# Patient Record
Sex: Male | Born: 1960 | Race: White | Hispanic: No | Marital: Married | Smoking: Never smoker
Health system: Southern US, Community
[De-identification: ages and names within clinical notes are randomized; demographics above are authoritative.]

## PROBLEM LIST (undated history)

## (undated) DIAGNOSIS — M1990 Unspecified osteoarthritis, unspecified site: Secondary | ICD-10-CM

## (undated) DIAGNOSIS — I10 Essential (primary) hypertension: Secondary | ICD-10-CM

## (undated) HISTORY — PX: NECK SURGERY: SHX720

## (undated) HISTORY — PX: OTHER SURGICAL HISTORY: SHX169

---

## 1985-06-26 HISTORY — PX: OTHER SURGICAL HISTORY: SHX169

## 1999-02-14 ENCOUNTER — Encounter: Payer: Self-pay | Admitting: Emergency Medicine

## 1999-02-14 ENCOUNTER — Emergency Department (HOSPITAL_COMMUNITY): Admission: EM | Admit: 1999-02-14 | Discharge: 1999-02-14 | Payer: Self-pay | Admitting: Emergency Medicine

## 2003-08-04 ENCOUNTER — Emergency Department (HOSPITAL_COMMUNITY): Admission: EM | Admit: 2003-08-04 | Discharge: 2003-08-04 | Payer: Self-pay | Admitting: Emergency Medicine

## 2003-08-08 ENCOUNTER — Ambulatory Visit (HOSPITAL_COMMUNITY): Admission: RE | Admit: 2003-08-08 | Discharge: 2003-08-08 | Payer: Self-pay | Admitting: Pediatrics

## 2003-08-18 ENCOUNTER — Ambulatory Visit (HOSPITAL_COMMUNITY): Admission: RE | Admit: 2003-08-18 | Discharge: 2003-08-18 | Payer: Self-pay | Admitting: Pediatrics

## 2003-09-14 ENCOUNTER — Ambulatory Visit (HOSPITAL_COMMUNITY): Admission: RE | Admit: 2003-09-14 | Discharge: 2003-09-15 | Payer: Self-pay | Admitting: Neurosurgery

## 2003-10-21 ENCOUNTER — Emergency Department (HOSPITAL_COMMUNITY): Admission: EM | Admit: 2003-10-21 | Discharge: 2003-10-21 | Payer: Self-pay | Admitting: Emergency Medicine

## 2010-12-22 ENCOUNTER — Other Ambulatory Visit (HOSPITAL_COMMUNITY): Payer: Self-pay | Admitting: Pediatrics

## 2010-12-22 DIAGNOSIS — R221 Localized swelling, mass and lump, neck: Secondary | ICD-10-CM

## 2010-12-26 ENCOUNTER — Other Ambulatory Visit (HOSPITAL_COMMUNITY): Payer: Self-pay

## 2010-12-26 ENCOUNTER — Ambulatory Visit (HOSPITAL_COMMUNITY)
Admission: RE | Admit: 2010-12-26 | Discharge: 2010-12-26 | Disposition: A | Discharge status: Home / Self Care | Payer: 59 | Source: Ambulatory Visit | Attending: Pediatrics | Admitting: Pediatrics

## 2010-12-26 DIAGNOSIS — R221 Localized swelling, mass and lump, neck: Secondary | ICD-10-CM

## 2010-12-26 DIAGNOSIS — R599 Enlarged lymph nodes, unspecified: Secondary | ICD-10-CM | POA: Insufficient documentation

## 2010-12-26 DIAGNOSIS — R220 Localized swelling, mass and lump, head: Secondary | ICD-10-CM | POA: Insufficient documentation

## 2010-12-26 MED ORDER — IOHEXOL 300 MG/ML  SOLN
75.0000 mL | Freq: Once | INTRAMUSCULAR | Status: AC | PRN
  Administered 2010-12-26: 75 mL via INTRAVENOUS

## 2011-01-05 ENCOUNTER — Ambulatory Visit (INDEPENDENT_AMBULATORY_CARE_PROVIDER_SITE_OTHER): Payer: 59 | Admitting: Otolaryngology

## 2011-01-05 DIAGNOSIS — D487 Neoplasm of uncertain behavior of other specified sites: Secondary | ICD-10-CM

## 2011-02-09 ENCOUNTER — Ambulatory Visit (INDEPENDENT_AMBULATORY_CARE_PROVIDER_SITE_OTHER): Payer: 59 | Admitting: Otolaryngology

## 2011-02-09 DIAGNOSIS — D487 Neoplasm of uncertain behavior of other specified sites: Secondary | ICD-10-CM

## 2013-07-23 ENCOUNTER — Telehealth: Payer: Self-pay

## 2013-07-23 NOTE — Telephone Encounter (Signed)
Pt was referred by Dr. Nevada Crane for screening colonoscopy. He will check his calendar and call me tomorrow to schedule.

## 2013-07-24 ENCOUNTER — Other Ambulatory Visit: Payer: Self-pay

## 2013-07-24 DIAGNOSIS — Z1211 Encounter for screening for malignant neoplasm of colon: Secondary | ICD-10-CM

## 2013-07-28 NOTE — Telephone Encounter (Signed)
Gastroenterology Pre-Procedure Review  Request Date: 07/25/2013 Requesting Physician: Dr. Wende Neighbors  This will be pt's first colonoscopy  PATIENT REVIEW QUESTIONS: The patient responded to the following health history questions as indicated:    1. Diabetes Melitis: no 2. Joint replacements in the past 12 months: no 3. Major health problems in the past 3 months: no 4. Has an artificial valve or MVP: no 5. Has a defibrillator: no 6. Has been advised in past to take antibiotics in advance of a procedure like teeth cleaning: no    MEDICATIONS & ALLERGIES:    Patient reports the following regarding taking any blood thinners:   Plavix? no Aspirin? no Coumadin? no  Patient confirms/reports the following medications:  Current Outpatient Prescriptions  Medication Sig Dispense Refill  . amLODipine (NORVASC) 5 MG tablet Take 5 mg by mouth daily.      . NON FORMULARY Goody's  prn       No current facility-administered medications for this visit.    Patient confirms/reports the following allergies:  No Known Allergies  No orders of the defined types were placed in this encounter.    AUTHORIZATION INFORMATION Primary Insurance:   ID #:   Group #:  Pre-Cert / Auth required Pre-Cert / Auth #:   Secondary Insurance:   ID #:   Group #:  Pre-Cert / Auth required: Pre-Cert / Auth #:   SCHEDULE INFORMATION: Procedure has been scheduled as follows:  Date: 08/15/2013             Time:  9:15 AM Location: [facility] Short Stay  This Gastroenterology Pre-Precedure Review Form is being routed to the following provider(s): Barney Drain, MD

## 2013-07-29 MED ORDER — SOD PICOSULFATE-MAG OX-CIT ACD 10-3.5-12 MG-GM-GM PO PACK: 1.0000 | PACK | ORAL | Status: DC

## 2013-07-29 NOTE — Telephone Encounter (Signed)
Rx sent to the pharmacy and instructions mailed to pt.  

## 2013-07-29 NOTE — Telephone Encounter (Signed)
PREPOPIK-DRINK WATER TO KEEP URINE LIGHT YELLOW.  PT SHOULD DROP OFF RX 3 DAYS PRIOR TO PROCEDURE.  

## 2013-08-04 ENCOUNTER — Encounter (HOSPITAL_COMMUNITY): Payer: Self-pay | Admitting: Pharmacy Technician

## 2013-08-13 ENCOUNTER — Telehealth: Payer: Self-pay

## 2013-08-13 NOTE — Telephone Encounter (Signed)
I called [employer] at [phone] and spoke to [place]. Who said that a PA is not required for a screening colonoscopy.

## 2013-08-15 ENCOUNTER — Encounter (HOSPITAL_COMMUNITY)
Admission: RE | Disposition: A | Discharge status: Home / Self Care | Payer: Self-pay | Source: Ambulatory Visit | Attending: Gastroenterology

## 2013-08-15 ENCOUNTER — Encounter (HOSPITAL_COMMUNITY): Payer: Self-pay | Admitting: *Deleted

## 2013-08-15 ENCOUNTER — Ambulatory Visit (HOSPITAL_COMMUNITY)
Admission: RE | Admit: 2013-08-15 | Discharge: 2013-08-15 | Disposition: A | Discharge status: Home / Self Care | Payer: 59 | Source: Ambulatory Visit | Attending: Gastroenterology | Admitting: Gastroenterology

## 2013-08-15 DIAGNOSIS — K648 Other hemorrhoids: Secondary | ICD-10-CM | POA: Insufficient documentation

## 2013-08-15 DIAGNOSIS — Z1211 Encounter for screening for malignant neoplasm of colon: Secondary | ICD-10-CM | POA: Insufficient documentation

## 2013-08-15 HISTORY — DX: Essential (primary) hypertension: I10

## 2013-08-15 HISTORY — PX: COLONOSCOPY: SHX5424

## 2013-08-15 SURGERY — COLONOSCOPY
Anesthesia: Moderate Sedation

## 2013-08-15 MED ORDER — MIDAZOLAM HCL 5 MG/5ML IJ SOLN
INTRAMUSCULAR | Status: DC | PRN
  Administered 2013-08-15 (×3): 2 mg via INTRAVENOUS

## 2013-08-15 MED ORDER — STERILE WATER FOR IRRIGATION IR SOLN
Status: DC | PRN
  Administered 2013-08-15: 09:00:00

## 2013-08-15 MED ORDER — SODIUM CHLORIDE 0.9 % IV SOLN
INTRAVENOUS | Status: DC
  Administered 2013-08-15: 09:00:00 via INTRAVENOUS

## 2013-08-15 MED ORDER — MEPERIDINE HCL 100 MG/ML IJ SOLN
INTRAMUSCULAR | Status: AC
  Filled 2013-08-15: qty 2

## 2013-08-15 MED ORDER — MIDAZOLAM HCL 5 MG/5ML IJ SOLN
INTRAMUSCULAR | Status: AC
  Filled 2013-08-15: qty 10

## 2013-08-15 MED ORDER — MEPERIDINE HCL 100 MG/ML IJ SOLN
INTRAMUSCULAR | Status: DC | PRN
  Administered 2013-08-15 (×2): 50 mg via INTRAVENOUS

## 2013-08-15 NOTE — Discharge Instructions (Signed)
You have internal hemorrhoids. YOU DID NOT HAVE ANY POLYPS.   IF YOU ARE GOING TO USE ALEVE AND GOODYS, YOU SHOULD TAKE PRILOSEC TO PREVENT ULCERS.  FOLLOW A HIGH FIBER DIET. AVOID ITEMS THAT CAUSE BLOATING. SEE INFO BELOW  Next colonoscopy in 10 years. .   Colonoscopy Care After Read the instructions outlined below and refer to this sheet in the next week. These discharge instructions provide you with general information on caring for yourself after you leave the hospital. While your treatment has been planned according to the most current medical practices available, unavoidable complications occasionally occur. If you have any problems or questions after discharge, call DR. FIELDS, [phone].  ACTIVITY  You may resume your regular activity, but move at a slower pace for the next 24 hours.   Take frequent rest periods for the next 24 hours.   Walking will help get rid of the air and reduce the bloated feeling in your belly (abdomen).   No driving for 24 hours (because of the medicine (anesthesia) used during the test).   You may shower.   Do not sign any important legal documents or operate any machinery for 24 hours (because of the anesthesia used during the test).    NUTRITION  Drink plenty of fluids.   You may resume your normal diet as instructed by your doctor.   Begin with a light meal and progress to your normal diet. Heavy or fried foods are harder to digest and may make you feel sick to your stomach (nauseated).   Avoid alcoholic beverages for 24 hours or as instructed.    MEDICATIONS  You may resume your normal medications.   WHAT YOU CAN EXPECT TODAY  Some feelings of bloating in the abdomen.   Passage of more gas than usual.   Spotting of blood in your stool or on the toilet paper  .  IF YOU HAD POLYPS REMOVED DURING THE COLONOSCOPY:  Eat a soft diet IF YOU HAVE NAUSEA, BLOATING, ABDOMINAL PAIN, OR VOMITING.    FINDING OUT THE RESULTS OF  YOUR TEST Not all test results are available during your visit. DR. Oneida Alar WILL CALL YOU WITHIN 7 DAYS OF YOUR PROCEDUE WITH YOUR RESULTS. Do not assume everything is normal if you have not heard from DR. FIELDS IN ONE WEEK, CALL HER OFFICE AT [phone].  SEEK IMMEDIATE MEDICAL ATTENTION AND CALL THE OFFICE: [phone] IF:  You have more than a spotting of blood in your stool.   Your belly is swollen (abdominal distention).   You are nauseated or vomiting.   You have a temperature over 101F.   You have abdominal pain or discomfort that is severe or gets worse throughout the day.  High-Fiber Diet A high-fiber diet changes your normal diet to include more whole grains, legumes, fruits, and vegetables. Changes in the diet involve replacing refined carbohydrates with unrefined foods. The calorie level of the diet is essentially unchanged. The Dietary Reference Intake (recommended amount) for adult males is 38 grams per day. For adult females, it is 25 grams per day. Pregnant and lactating women should consume 28 grams of fiber per day. Fiber is the intact part of a plant that is not broken down during digestion. Functional fiber is fiber that has been isolated from the plant to provide a beneficial effect in the body. PURPOSE  Increase stool bulk.   Ease and regulate bowel movements.   Lower cholesterol.  INDICATIONS THAT YOU NEED MORE FIBER  Constipation and  hemorrhoids.   Uncomplicated diverticulosis (intestine condition) and irritable bowel syndrome.   Weight management.   As a protective measure against hardening of the arteries (atherosclerosis), diabetes, and cancer.   GUIDELINES FOR INCREASING FIBER IN THE DIET  Start adding fiber to the diet slowly. A gradual increase of about 5 more grams (2 slices of whole-wheat bread, 2 servings of most fruits or vegetables, or 1 bowl of high-fiber cereal) per day is best. Too rapid an increase in fiber may result in constipation,  flatulence, and bloating.   Drink enough water and fluids to keep your urine clear or pale yellow. Water, juice, or caffeine-free drinks are recommended. Not drinking enough fluid may cause constipation.   Eat a variety of high-fiber foods rather than one type of fiber.   Try to increase your intake of fiber through using high-fiber foods rather than fiber pills or supplements that contain small amounts of fiber.   The goal is to change the types of food eaten. Do not supplement your present diet with high-fiber foods, but replace foods in your present diet.  INCLUDE A VARIETY OF FIBER SOURCES  Replace refined and processed grains with whole grains, canned fruits with fresh fruits, and incorporate other fiber sources. White rice, white breads, and most bakery goods contain little or no fiber.   Brown whole-grain rice, buckwheat oats, and many fruits and vegetables are all good sources of fiber. These include: broccoli, Brussels sprouts, cabbage, cauliflower, beets, sweet potatoes, white potatoes (skin on), carrots, tomatoes, eggplant, squash, berries, fresh fruits, and dried fruits.   Cereals appear to be the richest source of fiber. Cereal fiber is found in whole grains and bran. Bran is the fiber-rich outer coat of cereal grain, which is largely removed in refining. In whole-grain cereals, the bran remains. In breakfast cereals, the largest amount of fiber is found in those with "bran" in their names. The fiber content is sometimes indicated on the label.   You may need to include additional fruits and vegetables each day.   In baking, for 1 cup white flour, you may use the following substitutions:   1 cup whole-wheat flour minus 2 tablespoons.   1/2 cup white flour plus 1/2 cup whole-wheat flour.   Hemorrhoids Hemorrhoids are dilated (enlarged) veins around the rectum. Sometimes clots will form in the veins. This makes them swollen and painful. These are called thrombosed  hemorrhoids. Causes of hemorrhoids include:  Constipation.   Straining to have a bowel movement.   HEAVY LIFTING HOME CARE INSTRUCTIONS  Eat a well balanced diet and drink 6 to 8 glasses of water every day to avoid constipation. You may also use a bulk laxative.   Avoid straining to have bowel movements.   Keep anal area dry and clean.   Do not use a donut shaped pillow or sit on the toilet for long periods. This increases blood pooling and pain.   Move your bowels when your body has the urge; this will require less straining and will decrease pain and pressure.

## 2013-08-15 NOTE — Op Note (Signed)
[facility] [address] [place], [address]   COLONOSCOPY PROCEDURE REPORT  PATIENT: Eugene Hill, Eugene Hill  MR#: [id number] BIRTHDATE: March 01, 1961 , 52  yrs. old GENDER: Male ENDOSCOPIST: Barney Drain, MD REFERRED MV:[facility], M.D. PROCEDURE DATE:  08/15/2013 PROCEDURE:   Colonoscopy, screening INDICATIONS:Average risk patient for colon cancer. MEDICATIONS: Demerol 100 mg IV and Versed 6 mg IV  DESCRIPTION OF PROCEDURE:    Physical exam was performed.  Informed consent was obtained from the patient after explaining the benefits, risks, and alternatives to procedure.  The patient was connected to monitor and placed in left lateral position. Continuous oxygen was provided by nasal cannula and IV medicine administered through an indwelling cannula.  After administration of sedation and rectal exam, the patients rectum was intubated and the EC-[id number] ([id number])  colonoscope was advanced under direct visualization to the ileum.  The scope was removed slowly by carefully examining the color, texture, anatomy, and integrity mucosa on the way out.  The patient was recovered in endoscopy and discharged home in satisfactory condition.       COLON FINDINGS: The mucosa appeared normal in the terminal ileum.  , A normal appearing cecum, ileocecal valve, and appendiceal orifice were identified.  The ascending, hepatic flexure, transverse, splenic flexure, descending, sigmoid colon and rectum appeared unremarkable.  No polyps or cancers were seen.  , and Small internal hemorrhoids were found.  PREP QUALITY: good.  CECAL W/D TIME: 12 minutes     COMPLICATIONS: None  ENDOSCOPIC IMPRESSION: 1.   Normal mucosa in the terminal ileum 2.   Normal colon 3.   Small internal hemorrhoids  RECOMMENDATIONS: HIGH FIBER DIET USE PPI WITH ALEVE AND GOODY POWDERS TCS IN 10 YEARS       _______________________________ eSignedBarney Drain, MD 08/15/2013 10:42 AM

## 2013-08-15 NOTE — H&P (Signed)
  Primary Care Physician:  Delphina Cahill, MD Primary Gastroenterologist:  Dr. Oneida Alar  Pre-Procedure History & Physical: HPI:  Eugene Hill is a 53 y.o. male here for COLON CANCER SCREENING.  No past medical history on file.  No past surgical history on file.  Prior to Admission medications   Medication Sig Start Date End Date Taking? Authorizing Provider  amLODipine (NORVASC) 5 MG tablet Take 5 mg by mouth daily.   Yes Historical Provider, MD  Aspirin-Acetaminophen-Caffeine (GOODY HEADACHE PO) Take 1 packet by mouth daily as needed. headaches    Historical Provider, MD  naproxen sodium (ALEVE) 220 MG tablet Take 220 mg by mouth daily as needed. For headaches or aches and pain    Historical Provider, MD    Allergies as of 07/24/2013  . (No Known Allergies)    No family history on file.  History   Social History  . Marital Status: Married    Spouse Name: N/A    Number of Children: N/A  . Years of Education: N/A   Occupational History  . Not on file.   Social History Main Topics  . Smoking status: Not on file  . Smokeless tobacco: Not on file  . Alcohol Use: Not on file  . Drug Use: Not on file  . Sexual Activity: Not on file   Other Topics Concern  . Not on file   Social History Narrative  . No narrative on file    Review of Systems: See HPI, otherwise negative ROS   Physical Exam:  General:   Alert,  pleasant and cooperative in NAD Head:  Normocephalic and atraumatic. Neck:  Supple; Lungs:  Clear throughout to auscultation.    Heart:  Regular rate and rhythm. Abdomen:  Soft, nontender and nondistended. Normal bowel sounds, without guarding, and without rebound.   Neurologic:  Alert and  oriented x4;  grossly normal neurologically.  Impression/Plan:    SCREENING  Plan:  1. TCS TODAY

## 2013-08-18 ENCOUNTER — Encounter (HOSPITAL_COMMUNITY): Payer: Self-pay | Admitting: Gastroenterology

## 2014-07-07 DIAGNOSIS — I10 Essential (primary) hypertension: Secondary | ICD-10-CM | POA: Insufficient documentation

## 2014-07-08 ENCOUNTER — Other Ambulatory Visit (HOSPITAL_COMMUNITY): Payer: Self-pay | Admitting: Orthopaedic Surgery

## 2014-07-08 DIAGNOSIS — M25561 Pain in right knee: Secondary | ICD-10-CM

## 2014-07-14 ENCOUNTER — Other Ambulatory Visit (HOSPITAL_COMMUNITY): Payer: Self-pay | Admitting: Orthopaedic Surgery

## 2014-07-14 ENCOUNTER — Ambulatory Visit (HOSPITAL_COMMUNITY)
Admission: RE | Admit: 2014-07-14 | Discharge: 2014-07-14 | Disposition: A | Discharge status: Home / Self Care | Payer: 59 | Source: Ambulatory Visit | Attending: Orthopaedic Surgery | Admitting: Orthopaedic Surgery

## 2014-07-14 DIAGNOSIS — M25561 Pain in right knee: Secondary | ICD-10-CM

## 2014-07-14 DIAGNOSIS — M7989 Other specified soft tissue disorders: Secondary | ICD-10-CM | POA: Diagnosis not present

## 2014-07-20 ENCOUNTER — Encounter: Payer: Self-pay | Admitting: Orthopedic Surgery

## 2014-07-20 ENCOUNTER — Other Ambulatory Visit: Payer: Self-pay | Admitting: *Deleted

## 2014-07-20 ENCOUNTER — Telehealth: Payer: Self-pay | Admitting: Orthopedic Surgery

## 2014-07-20 ENCOUNTER — Ambulatory Visit (INDEPENDENT_AMBULATORY_CARE_PROVIDER_SITE_OTHER): Payer: 59 | Admitting: Orthopedic Surgery

## 2014-07-20 VITALS — BP 134/89 | Ht 70.0 in | Wt 245.0 lb

## 2014-07-20 DIAGNOSIS — S83519A Sprain of anterior cruciate ligament of unspecified knee, initial encounter: Secondary | ICD-10-CM | POA: Insufficient documentation

## 2014-07-20 DIAGNOSIS — S83249A Other tear of medial meniscus, current injury, unspecified knee, initial encounter: Secondary | ICD-10-CM | POA: Insufficient documentation

## 2014-07-20 DIAGNOSIS — S83511S Sprain of anterior cruciate ligament of right knee, sequela: Secondary | ICD-10-CM

## 2014-07-20 DIAGNOSIS — M1711 Unilateral primary osteoarthritis, right knee: Secondary | ICD-10-CM | POA: Insufficient documentation

## 2014-07-20 DIAGNOSIS — S83241A Other tear of medial meniscus, current injury, right knee, initial encounter: Secondary | ICD-10-CM

## 2014-07-20 NOTE — Telephone Encounter (Signed)
NOTED AND SURGERY SCHEDULED

## 2014-07-20 NOTE — Telephone Encounter (Signed)
Received call back from patient; states he does wish to schedule the surgery for 07/31/14, as discussed at appointment earlier today.  His ph# is [phone]

## 2014-07-20 NOTE — Patient Instructions (Signed)
Call back to confirm Feb 5 surgery date, Right knee arthroscopy with medial menisectomy

## 2014-07-20 NOTE — Progress Notes (Signed)
Patient ID: Eugene Hill, male   DOB: 1961-03-02, 54 y.o.   MRN: [id number] New   Referred by Dr Luna Glasgow   Chief Complaint  Patient presents with  . Knee Pain    Right knee pain and swelling- chronic torn ACL PCL intact, REF. KEELING    History of present illness this patient is 54 years old he has had a meniscectomy back in 1987 injured his right knee several years ago. He doesn't know how. At that time he was found to have a anterior cruciate ligament tear which was not repaired  He is referred to me for further treatment and management. His main problem is knee pain, and inability to squat and kneel which is required for his profession. He complains of sharp stabbing pain associated with swelling stiffness and giving out worse at night and worse after activity. He was treated with Aleve. His knee pain is worse with crawling and squatting.  Review of systems he listed all of his systems reviewed is normal except for the musculoskeletal system as described  Past medical history of hypertension previous fracture  1981 he had a left forearm fracture. Surgery included right knee arthroscopy 1987 had a neck procedure in 2005  Current medications are amlodipine 5 mg One-A-Day vitamins and Aleve one tablet as needed.  No allergies  Family history hypertension heart failure no anesthesia problems  Social history he does not smoke he may have 2-3 drinks per week he does not use any street drugs and he works in maintenance and sheet metal  His evaluation includes an MRI dated 07/14/2014 Patellofemoral: Focal thinning of the cartilage at the apex and medial facet of the patella and in the trochlear groove of the distal femur. 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   IMPRESSION: 1. Extensive displaced bucket-handle tear involving the medial meniscus. 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 2. 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 3. Tiny focal superior surface tear of the midbody of the lateral meniscus. 4. 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  I agree basically has an arthritic knee with an old anterior cruciate ligament tear and a fairly significant displaced bucket-handle tear of the medial meniscus which is probably from chronic anterior cruciate ligament deficient knee  His notes from Dr. Luna Glasgow office also noted. They confirm BASIC history I have elucidated above  BP 134/89 mmHg  Ht 5\' 10"  (1.778 m)  Wt 245 lb (111.131 kg)  BMI 35.15 kg/m2 General appearance muscular well-built mesomorphic body habitus. He is oriented 3. His mood is pleasant his affect is normal. He ambulated without assistive devices  His upper extremities are normal  His left lower extremity skin is normal good muscle tone normal ligaments. Range of motion is full he has no swelling or tenderness neurovascular exam is intact lymph nodes are negative and balance is normal  Right knee shows no anterior cruciate ligament instability at 90 or at 20 collateral ligaments are stable muscle tone and strength are grade 5 skin is intact pulses are good temperature is normal lymph nodes are negative sensation is intact pathologic reflexes none balance is normal. He does have medial joint line tenderness and tenderness in the proximal tibia with no joint effusion and he has flexion loss of 15 compared to the left knee  McMurray sign positive  Encounter Diagnoses  Name Primary?  . Torn medial  meniscus, right, initial encounter Yes  . Anterior cruciate ligament tear, right, sequela   . Primary osteoarthritis of right knee     Assessment the patient's plain films do show some arthritic changes some secondary bone changes but his joint spaces are maintained.  He basically hasn't anterior cruciate ligament chronic deficient knee with arthritic changes  and a displaced meniscal tear. His grade 4 lesion is most concerning for long-term health of his knee and function. We have discussed that the fact that he can't bend his knee may or may not be caused by his displaced meniscal tear and he may still have trouble squatting and bending the knee after surgery. However I think it's prudent to get the displaced bucket-handle tear out of his knee that should improve his overall function. He will still have arthritis and we have discussed we will need to treat that medically.  He may also need bracing.  We have both decided that it is best at this point to perform arthroscopic surgery on the right knee with a partial medial meniscectomy and limit chondral debridement of flap tears.

## 2014-07-24 ENCOUNTER — Telehealth: Payer: Self-pay | Admitting: Orthopedic Surgery

## 2014-07-24 NOTE — Telephone Encounter (Signed)
[place], [place] [phone], regarding out-patient surgery scheduled 07/31/14 at [facility], knee arthroscopy/medial meniscectomy, [id number] - no pre-authorization required per Renea R, Reference# [id number]

## 2014-07-28 ENCOUNTER — Encounter (HOSPITAL_COMMUNITY)
Admission: RE | Admit: 2014-07-28 | Discharge: 2014-07-28 | Disposition: A | Discharge status: Home / Self Care | Payer: 59 | Source: Ambulatory Visit | Attending: Orthopedic Surgery | Admitting: Orthopedic Surgery

## 2014-07-28 ENCOUNTER — Encounter (HOSPITAL_COMMUNITY): Payer: Self-pay

## 2014-07-28 ENCOUNTER — Other Ambulatory Visit: Payer: Self-pay

## 2014-07-28 DIAGNOSIS — M13861 Other specified arthritis, right knee: Secondary | ICD-10-CM | POA: Diagnosis not present

## 2014-07-28 DIAGNOSIS — S83241A Other tear of medial meniscus, current injury, right knee, initial encounter: Secondary | ICD-10-CM | POA: Diagnosis present

## 2014-07-28 DIAGNOSIS — S83281A Other tear of lateral meniscus, current injury, right knee, initial encounter: Secondary | ICD-10-CM | POA: Diagnosis not present

## 2014-07-28 DIAGNOSIS — X58XXXA Exposure to other specified factors, initial encounter: Secondary | ICD-10-CM | POA: Diagnosis not present

## 2014-07-28 DIAGNOSIS — R9431 Abnormal electrocardiogram [ECG] [EKG]: Secondary | ICD-10-CM | POA: Diagnosis not present

## 2014-07-28 DIAGNOSIS — Z8249 Family history of ischemic heart disease and other diseases of the circulatory system: Secondary | ICD-10-CM | POA: Diagnosis not present

## 2014-07-28 DIAGNOSIS — Y929 Unspecified place or not applicable: Secondary | ICD-10-CM | POA: Diagnosis not present

## 2014-07-28 DIAGNOSIS — Y939 Activity, unspecified: Secondary | ICD-10-CM | POA: Diagnosis not present

## 2014-07-28 DIAGNOSIS — S83511A Sprain of anterior cruciate ligament of right knee, initial encounter: Secondary | ICD-10-CM | POA: Diagnosis not present

## 2014-07-28 DIAGNOSIS — I10 Essential (primary) hypertension: Secondary | ICD-10-CM | POA: Diagnosis not present

## 2014-07-28 DIAGNOSIS — Y999 Unspecified external cause status: Secondary | ICD-10-CM | POA: Diagnosis not present

## 2014-07-28 HISTORY — DX: Unspecified osteoarthritis, unspecified site: M19.90

## 2014-07-28 LAB — BASIC METABOLIC PANEL
ANION GAP: 5 (ref 5–15)
BUN: 18 mg/dL (ref 6–23)
CHLORIDE: 108 mmol/L (ref 96–112)
CO2: 26 mmol/L (ref 19–32)
Calcium: 8.7 mg/dL (ref 8.4–10.5)
Creatinine, Ser: 0.88 mg/dL (ref 0.50–1.35)
GFR calc Af Amer: >90 mL/min (ref >90)
GFR calc non Af Amer: >90 mL/min (ref >90)
Glucose, Bld: 130 mg/dL — ABNORMAL HIGH (ref 70–99)
POTASSIUM: 3.9 mmol/L (ref 3.5–5.1)
Sodium: 139 mmol/L (ref 135–145)

## 2014-07-28 LAB — CBC
HCT: 46.1 % (ref 39.0–52.0)
Hemoglobin: 16.1 g/dL (ref 13.0–17.0)
MCH: 30.5 pg (ref 26.0–34.0)
MCHC: 34.9 g/dL (ref 30.0–36.0)
MCV: 87.3 fL (ref 78.0–100.0)
Platelets: 253 10*3/uL (ref 150–400)
RBC: 5.28 MIL/uL (ref 4.22–5.81)
RDW: 12.8 % (ref 11.5–15.5)
WBC: 6.5 10*3/uL (ref 4.0–10.5)

## 2014-07-28 NOTE — Pre-Procedure Instructions (Signed)
Patient given information to sign up for my chart at home. 

## 2014-07-28 NOTE — Progress Notes (Signed)
   07/28/14 0906  OBSTRUCTIVE SLEEP APNEA  Have you ever been diagnosed with sleep apnea through a sleep study? No  Do you snore loudly (loud enough to be heard through closed doors)?  0  Do you often feel tired, fatigued, or sleepy during the daytime? 0  Has anyone observed you stop breathing during your sleep? 0  Do you have, or are you being treated for high blood pressure? 1  BMI more than 35 kg/m2? 1  Age over 52 years old? 1  Neck circumference greater than 40 cm/16 inches? 0  Gender: 1  Obstructive Sleep Apnea Score 4  Score 4 or greater  Results sent to PCP

## 2014-07-28 NOTE — Patient Instructions (Signed)
Eugene Hill  07/28/2014   Your procedure is scheduled on:  07/31/2014  Report to [facility] at 8:00 AM.  Call this number if you have problems the morning of surgery: [phone]   Remember:   Do not eat food or drink liquids after midnight.   Take these medicines the morning of surgery with A SIP OF WATER: Norvasc   Do not wear jewelry, make-up or nail polish.  Do not wear lotions, powders, or perfumes. You may wear deodorant.  Do not shave 48 hours prior to surgery. Men may shave face and neck.  Do not bring valuables to the hospital.  [facility] is not responsible                  for any belongings or valuables.               Contacts, dentures or bridgework may not be worn into surgery.  Leave suitcase in the car. After surgery it may be brought to your room.  For patients admitted to the hospital, discharge time is determined by your                treatment team.               Patients discharged the day of surgery will not be allowed to drive  home.  Name and phone number of your driver: family  Special Instructions: Shower using CHG 2 nights before surgery and the night before surgery.  If you shower the day of surgery use CHG.  Use special wash - you have one bottle of CHG for all showers.  You should use approximately 1/3 of the bottle for each shower.   Please read over the following fact sheets that you were given: Anesthesia Post-op Instructions    Meniscus Injury, Arthroscopy Arthroscopy is a surgical procedure that involves the use of a small scope that has a camera and surgical instruments on the end (arthroscope). An arthroscope can be used to repair your meniscus injury.  LET [facility] CARE PROVIDER KNOW ABOUT:  Any allergies you have.  All medicines you are taking, including vitamins, herbs, eyedrops, creams, and over-the-counter medicines.  Any recent colds or infections you have had or currently have.  Previous problems you or members of your family  have had with the use of anesthetics.  Any blood disorders or blood clotting problems you have.  Previous surgeries you have had.  Medical conditions you have. RISKS AND COMPLICATIONS Generally, this is a safe procedure. However, as with any procedure, problems can occur. Possible problems include:  Damage to nerves or blood vessels.  Excess bleeding.  Blood clots.  Infection. BEFORE THE PROCEDURE  Do not eat or drink for 6-8 hours before the procedure.  Take medicines as directed by your surgeon. Ask your surgeon about changing or stopping your regular medicines.  You may have lab tests the morning of surgery. PROCEDURE  You will be given one of the following:   A medicine that numbs the area (local anesthesia).  A medicine that makes you go to sleep (general anesthesia).  A medicine injected into your spine that numbs your body below the waist (spinal anesthesia). Most often, several small cuts (incisions) are made in the knee. The arthroscope and instruments go into the incisions to repair the damage. The torn portion of the meniscus is removed.  During this time, your surgeon may find a partial or complete tear in a cruciate ligament,  such as the anterior cruciate ligament (ACL). A completely torn cruciate ligament is reconstructed by taking tissue from another part of the body (grafting) and placing it into the injured area. This requires several larger incisions to complete the repair. Sometimes, open surgery is needed for collateral ligament injuries. If a collateral ligament is found to be injured, your surgeon may staple or suture the tear through a slightly larger incision on the side of the knee. AFTER THE PROCEDURE You will be taken to the recovery area where your progress will be monitored. When you are awake, stable, and taking fluids without complications, you will be allowed to go home. This is usually the same day. However, more extensive repairs of a ligament may  require an overnight stay.  The recovery time after repairing your meniscus or ligament depends on the amount of damage to these structures. It also depends on whether or not reconstructive knee surgery was needed.   A torn or stretched ligament (ligament sprain) may take 6-8 weeks to heal. It takes about the same amount of time if your surgeon removed a torn meniscus.  A repaired meniscus may require 6-12 weeks of recovery time.  A torn ligament needing reconstructive surgery may take 6-12 months to heal fully. Document Released: 06/09/2000 Document Revised: 06/17/2013 Document Reviewed: 11/08/2012 [facility] Patient Information 2015 [place], [place]. This information is not intended to replace advice given to you by your health care provider. Make sure you discuss any questions you have with your health care provider.

## 2014-07-30 ENCOUNTER — Encounter: Payer: Self-pay | Admitting: Orthopedic Surgery

## 2014-07-30 NOTE — H&P (Signed)
New   Referred by Dr Luna Glasgow     Chief Complaint   Patient presents with   .  Knee Pain       Right knee pain and swelling- chronic torn ACL PCL intact, REF. KEELING     History of present illness this patient is 54 years old he has had a meniscectomy back in 1987 injured his right knee several years ago. He doesn't know how. At that time he was found to have a anterior cruciate ligament tear which was not repaired  He is referred to me for further treatment and management. His main problem is knee pain, and inability to squat and kneel which is required for his profession. He complains of sharp stabbing pain associated with swelling stiffness and giving out worse at night and worse after activity. He was treated with Aleve. His knee pain is worse with crawling and squatting.  Review of systems he listed all of his systems reviewed is normal except for the musculoskeletal system as described  Past medical history of hypertension previous fracture  1981 he had a left forearm fracture. Surgery included right knee arthroscopy 1987 had a neck procedure in 2005  Current medications are amlodipine 5 mg One-A-Day vitamins and Aleve one tablet as needed.  No allergies  Family history hypertension heart failure no anesthesia problems  Social history he does not smoke he may have 2-3 drinks per week he does not use any street drugs and he works in maintenance and sheet metal  His evaluation includes an MRI dated 07/14/2014 Patellofemoral: Focal thinning of the cartilage at the apex and medial facet of the patella and in the trochlear groove of the distal femur. 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   IMPRESSION: 1. Extensive displaced bucket-handle tear involving the medial meniscus. 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 2. 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 3. Tiny focal superior surface tear of the midbody of the lateral meniscus. 4. 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  I agree basically has an arthritic knee with an old anterior cruciate ligament tear and a fairly significant displaced bucket-handle tear of the medial meniscus which is probably from chronic anterior cruciate ligament deficient knee  His notes from Dr. Luna Glasgow office also noted. They confirm BASIC history I have elucidated above  BP 134/89 mmHg  Ht 5\' 10"  (1.778 m)  Wt 245 lb (111.131 kg)  BMI 35.15 kg/m2 General appearance muscular well-built mesomorphic body habitus. He is oriented 3. His mood is pleasant his affect is normal. He ambulated without assistive devices  His upper extremities are normal  His left lower extremity skin is normal good muscle tone normal ligaments. Range of motion is full he has no swelling or tenderness neurovascular exam is intact lymph nodes are negative and balance is normal  Right knee shows no anterior cruciate ligament instability at 90 or at 20 collateral ligaments are stable muscle tone and strength are grade 5 skin is intact pulses are good temperature is normal lymph nodes are negative sensation is intact pathologic reflexes none balance is normal. He does have medial joint line tenderness and tenderness in the proximal tibia with no joint effusion and he has flexion loss of 15 compared to the left knee  McMurray sign positive    Encounter Diagnoses   Name  Primary?   .  Torn medial meniscus,  right, initial encounter  Yes   .  Anterior cruciate ligament tear, right, sequela     .  Primary osteoarthritis of right knee       Assessment the patient's plain films do show some arthritic changes some secondary bone changes but his joint spaces are maintained.  He basically hasn't anterior cruciate ligament chronic deficient knee with arthritic changes and a displaced meniscal tear. His grade 4 lesion  is most concerning for long-term health of his knee and function. We have discussed that the fact that he can't bend his knee may or may not be caused by his displaced meniscal tear and he may still have trouble squatting and bending the knee after surgery. However I think it's prudent to get the displaced bucket-handle tear out of his knee that should improve his overall function. He will still have arthritis and we have discussed we will need to treat that medically.  He may also need bracing.  We have both decided that it is best at this point to perform arthroscopic surgery on the right knee with a partial medial meniscectomy and limit chondral debridement of flap tears.  MRI and imaging  Patellofemoral: Focal thinning of the cartilage at the apex and medial facet of the patella and in the trochlear groove of the distal femur. 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   IMPRESSION: 1. Extensive displaced bucket-handle tear involving the medial meniscus. 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 2. 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 3. Tiny focal superior surface tear of the midbody of the lateral meniscus. 4. 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

## 2014-07-31 ENCOUNTER — Encounter (HOSPITAL_COMMUNITY): Payer: Self-pay | Admitting: Orthopedic Surgery

## 2014-07-31 ENCOUNTER — Ambulatory Visit (HOSPITAL_COMMUNITY): Payer: 59 | Admitting: Anesthesiology

## 2014-07-31 ENCOUNTER — Encounter (HOSPITAL_COMMUNITY)
Admission: RE | Disposition: A | Discharge status: Home / Self Care | Payer: Self-pay | Source: Ambulatory Visit | Attending: Orthopedic Surgery

## 2014-07-31 ENCOUNTER — Ambulatory Visit (HOSPITAL_COMMUNITY)
Admission: RE | Admit: 2014-07-31 | Discharge: 2014-07-31 | Disposition: A | Discharge status: Home / Self Care | Payer: 59 | Source: Ambulatory Visit | Attending: Orthopedic Surgery | Admitting: Orthopedic Surgery

## 2014-07-31 DIAGNOSIS — S83511D Sprain of anterior cruciate ligament of right knee, subsequent encounter: Secondary | ICD-10-CM

## 2014-07-31 DIAGNOSIS — Y939 Activity, unspecified: Secondary | ICD-10-CM | POA: Insufficient documentation

## 2014-07-31 DIAGNOSIS — S83511A Sprain of anterior cruciate ligament of right knee, initial encounter: Secondary | ICD-10-CM | POA: Insufficient documentation

## 2014-07-31 DIAGNOSIS — X58XXXA Exposure to other specified factors, initial encounter: Secondary | ICD-10-CM | POA: Insufficient documentation

## 2014-07-31 DIAGNOSIS — S83289A Other tear of lateral meniscus, current injury, unspecified knee, initial encounter: Secondary | ICD-10-CM | POA: Insufficient documentation

## 2014-07-31 DIAGNOSIS — Y999 Unspecified external cause status: Secondary | ICD-10-CM | POA: Insufficient documentation

## 2014-07-31 DIAGNOSIS — M129 Arthropathy, unspecified: Secondary | ICD-10-CM

## 2014-07-31 DIAGNOSIS — Y929 Unspecified place or not applicable: Secondary | ICD-10-CM | POA: Insufficient documentation

## 2014-07-31 DIAGNOSIS — Z8249 Family history of ischemic heart disease and other diseases of the circulatory system: Secondary | ICD-10-CM | POA: Insufficient documentation

## 2014-07-31 DIAGNOSIS — S83281D Other tear of lateral meniscus, current injury, right knee, subsequent encounter: Secondary | ICD-10-CM

## 2014-07-31 DIAGNOSIS — R9431 Abnormal electrocardiogram [ECG] [EKG]: Secondary | ICD-10-CM | POA: Insufficient documentation

## 2014-07-31 DIAGNOSIS — M13861 Other specified arthritis, right knee: Secondary | ICD-10-CM | POA: Insufficient documentation

## 2014-07-31 DIAGNOSIS — M1711 Unilateral primary osteoarthritis, right knee: Secondary | ICD-10-CM | POA: Insufficient documentation

## 2014-07-31 DIAGNOSIS — I10 Essential (primary) hypertension: Secondary | ICD-10-CM | POA: Insufficient documentation

## 2014-07-31 DIAGNOSIS — S83241A Other tear of medial meniscus, current injury, right knee, initial encounter: Secondary | ICD-10-CM

## 2014-07-31 DIAGNOSIS — S83281A Other tear of lateral meniscus, current injury, right knee, initial encounter: Secondary | ICD-10-CM | POA: Insufficient documentation

## 2014-07-31 DIAGNOSIS — S83249A Other tear of medial meniscus, current injury, unspecified knee, initial encounter: Secondary | ICD-10-CM | POA: Insufficient documentation

## 2014-07-31 HISTORY — PX: KNEE ARTHROSCOPY WITH MEDIAL MENISECTOMY: SHX5651

## 2014-07-31 SURGERY — ARTHROSCOPY, KNEE, WITH MEDIAL MENISCECTOMY
Anesthesia: General | Site: Knee | Laterality: Right

## 2014-07-31 MED ORDER — EPHEDRINE SULFATE 50 MG/ML IJ SOLN
INTRAMUSCULAR | Status: DC | PRN
  Administered 2014-07-31: 5 mg via INTRAVENOUS
  Administered 2014-07-31: 10 mg via INTRAVENOUS

## 2014-07-31 MED ORDER — BUPIVACAINE-EPINEPHRINE (PF) 0.5% -1:200000 IJ SOLN
INTRAMUSCULAR | Status: AC
  Filled 2014-07-31: qty 60

## 2014-07-31 MED ORDER — EPHEDRINE SULFATE 50 MG/ML IJ SOLN
INTRAMUSCULAR | Status: AC
  Filled 2014-07-31: qty 1

## 2014-07-31 MED ORDER — FENTANYL CITRATE 0.05 MG/ML IJ SOLN: 25.0000 ug | INTRAMUSCULAR | Status: DC | PRN

## 2014-07-31 MED ORDER — LACTATED RINGERS IV SOLN
INTRAVENOUS | Status: DC
  Administered 2014-07-31 (×2): via INTRAVENOUS

## 2014-07-31 MED ORDER — ONDANSETRON HCL 4 MG/2ML IJ SOLN
4.0000 mg | Freq: Once | INTRAMUSCULAR | Status: AC
  Administered 2014-07-31: 4 mg via INTRAVENOUS
  Filled 2014-07-31: qty 2

## 2014-07-31 MED ORDER — CEFAZOLIN SODIUM-DEXTROSE 2-3 GM-% IV SOLR
INTRAVENOUS | Status: AC
  Filled 2014-07-31: qty 50

## 2014-07-31 MED ORDER — PROMETHAZINE HCL 12.5 MG PO TABS: 12.5000 mg | ORAL_TABLET | Freq: Four times a day (QID) | ORAL | Status: DC | PRN

## 2014-07-31 MED ORDER — FENTANYL CITRATE 0.05 MG/ML IJ SOLN
25.0000 ug | INTRAMUSCULAR | Status: AC
  Administered 2014-07-31 (×2): 25 ug via INTRAVENOUS

## 2014-07-31 MED ORDER — SODIUM CHLORIDE 0.9 % IR SOLN
Status: DC | PRN
  Administered 2014-07-31 (×4): 3000 mL

## 2014-07-31 MED ORDER — ONDANSETRON HCL 4 MG/2ML IJ SOLN
4.0000 mg | Freq: Once | INTRAMUSCULAR | Status: AC
  Administered 2014-07-31: 4 mg via INTRAVENOUS

## 2014-07-31 MED ORDER — CEFAZOLIN SODIUM-DEXTROSE 2-3 GM-% IV SOLR
2.0000 g | INTRAVENOUS | Status: AC
  Administered 2014-07-31: 2 g via INTRAVENOUS

## 2014-07-31 MED ORDER — ARTIFICIAL TEARS OP OINT
TOPICAL_OINTMENT | OPHTHALMIC | Status: AC
  Filled 2014-07-31: qty 3.5

## 2014-07-31 MED ORDER — BUPIVACAINE-EPINEPHRINE (PF) 0.5% -1:200000 IJ SOLN
INTRAMUSCULAR | Status: DC | PRN
  Administered 2014-07-31: 60 mL via PERINEURAL

## 2014-07-31 MED ORDER — SODIUM CHLORIDE 0.9 % IJ SOLN
INTRAMUSCULAR | Status: AC
  Filled 2014-07-31: qty 10

## 2014-07-31 MED ORDER — FENTANYL CITRATE 0.05 MG/ML IJ SOLN
INTRAMUSCULAR | Status: AC
  Filled 2014-07-31: qty 2

## 2014-07-31 MED ORDER — MIDAZOLAM HCL 2 MG/2ML IJ SOLN
INTRAMUSCULAR | Status: AC
  Filled 2014-07-31: qty 2

## 2014-07-31 MED ORDER — KETOROLAC TROMETHAMINE 30 MG/ML IJ SOLN
30.0000 mg | Freq: Once | INTRAMUSCULAR | Status: AC
  Administered 2014-07-31: 30 mg via INTRAVENOUS
  Filled 2014-07-31: qty 1

## 2014-07-31 MED ORDER — CHLORHEXIDINE GLUCONATE 4 % EX LIQD: 60.0000 mL | Freq: Once | CUTANEOUS | Status: DC

## 2014-07-31 MED ORDER — GLYCOPYRROLATE 0.2 MG/ML IJ SOLN
INTRAMUSCULAR | Status: AC
  Filled 2014-07-31: qty 1

## 2014-07-31 MED ORDER — PROPOFOL 10 MG/ML IV BOLUS
INTRAVENOUS | Status: DC | PRN
  Administered 2014-07-31: 30 mg via INTRAVENOUS
  Administered 2014-07-31: 20 mg via INTRAVENOUS
  Administered 2014-07-31: 200 mg via INTRAVENOUS

## 2014-07-31 MED ORDER — MIDAZOLAM HCL 2 MG/2ML IJ SOLN
1.0000 mg | INTRAMUSCULAR | Status: DC | PRN
  Administered 2014-07-31: 2 mg via INTRAVENOUS

## 2014-07-31 MED ORDER — LIDOCAINE HCL (CARDIAC) 10 MG/ML IV SOLN
INTRAVENOUS | Status: DC | PRN
  Administered 2014-07-31 (×2): 20 mg via INTRAVENOUS

## 2014-07-31 MED ORDER — PROPOFOL 10 MG/ML IV BOLUS
INTRAVENOUS | Status: AC
  Filled 2014-07-31: qty 20

## 2014-07-31 MED ORDER — HYDROCODONE-ACETAMINOPHEN 5-325 MG PO TABS
2.0000 | ORAL_TABLET | Freq: Once | ORAL | Status: AC
  Administered 2014-07-31: 2 via ORAL
  Filled 2014-07-31: qty 2

## 2014-07-31 MED ORDER — ONDANSETRON HCL 4 MG/2ML IJ SOLN
INTRAMUSCULAR | Status: AC
  Filled 2014-07-31: qty 2

## 2014-07-31 MED ORDER — SODIUM CHLORIDE 0.9 % IR SOLN
Status: DC | PRN
  Administered 2014-07-31: 1000 mL

## 2014-07-31 MED ORDER — HYDROCODONE-ACETAMINOPHEN 10-325 MG PO TABS: 1.0000 | ORAL_TABLET | ORAL | Status: DC | PRN

## 2014-07-31 MED ORDER — GLYCOPYRROLATE 0.2 MG/ML IJ SOLN
0.2000 mg | Freq: Once | INTRAMUSCULAR | Status: AC
  Administered 2014-07-31: 0.2 mg via INTRAVENOUS

## 2014-07-31 MED ORDER — FENTANYL CITRATE 0.05 MG/ML IJ SOLN
INTRAMUSCULAR | Status: DC | PRN
  Administered 2014-07-31: 50 ug via INTRAVENOUS
  Administered 2014-07-31: 25 ug via INTRAVENOUS
  Administered 2014-07-31: 50 ug via INTRAVENOUS

## 2014-07-31 MED ORDER — EPINEPHRINE HCL 1 MG/ML IJ SOLN
INTRAMUSCULAR | Status: AC
  Filled 2014-07-31: qty 3

## 2014-07-31 MED ORDER — ONDANSETRON HCL 4 MG/2ML IJ SOLN
4.0000 mg | Freq: Once | INTRAMUSCULAR | Status: DC | PRN
Start: 2014-07-31 — End: 2014-07-31

## 2014-07-31 SURGICAL SUPPLY — 54 items
ARTHROWAND PARAGON T2 (SURGICAL WAND)
BAG HAMPER (MISCELLANEOUS) ×3 IMPLANT
BANDAGE ELASTIC 6 VELCRO NS (GAUZE/BANDAGES/DRESSINGS) ×3 IMPLANT
BLADE 11 SAFETY STRL DISP (BLADE) ×3 IMPLANT
BLADE AGGRESSIVE PLUS 4.0 (BLADE) ×3 IMPLANT
CHLORAPREP W/TINT 26ML (MISCELLANEOUS) ×6 IMPLANT
CLOTH BEACON ORANGE TIMEOUT ST (SAFETY) ×3 IMPLANT
COOLER CRYO IC GRAV AND TUBE (ORTHOPEDIC SUPPLIES) ×3 IMPLANT
CUFF CRYO KNEE LG 20X31 COOLER (ORTHOPEDIC SUPPLIES) ×3 IMPLANT
CUFF CRYO KNEE18X23 MED (MISCELLANEOUS) IMPLANT
CUFF TOURNIQUET SINGLE 34IN LL (TOURNIQUET CUFF) ×3 IMPLANT
CUFF TOURNIQUET SINGLE 44IN (TOURNIQUET CUFF) IMPLANT
CUTTER ANGLED DBL BITE 4.5 (BURR) IMPLANT
CUTTER TOMCAT 5.0MM (BURR) ×3 IMPLANT
DECANTER SPIKE VIAL GLASS SM (MISCELLANEOUS) ×6 IMPLANT
GAUZE SPONGE 4X4 12PLY STRL (GAUZE/BANDAGES/DRESSINGS) ×2 IMPLANT
GAUZE SPONGE 4X4 16PLY XRAY LF (GAUZE/BANDAGES/DRESSINGS) ×3 IMPLANT
GAUZE XEROFORM 5X9 LF (GAUZE/BANDAGES/DRESSINGS) ×3 IMPLANT
GLOVE BIOGEL M 7.0 STRL (GLOVE) ×9 IMPLANT
GLOVE BIOGEL PI IND STRL 7.0 (GLOVE) ×3 IMPLANT
GLOVE BIOGEL PI INDICATOR 7.0 (GLOVE) ×6
GLOVE EXAM NITRILE MD LF STRL (GLOVE) ×3 IMPLANT
GLOVE SKINSENSE NS SZ8.0 LF (GLOVE) ×2
GLOVE SKINSENSE STRL SZ8.0 LF (GLOVE) ×1 IMPLANT
GLOVE SS N UNI LF 8.5 STRL (GLOVE) ×3 IMPLANT
GOWN STRL REUS W/TWL LRG LVL3 (GOWN DISPOSABLE) ×9 IMPLANT
GOWN STRL REUS W/TWL XL LVL3 (GOWN DISPOSABLE) ×3 IMPLANT
HLDR LEG FOAM (MISCELLANEOUS) ×1 IMPLANT
IV NS IRRIG 3000ML ARTHROMATIC (IV SOLUTION) ×12 IMPLANT
KIT BLADEGUARD II DBL (SET/KITS/TRAYS/PACK) ×3 IMPLANT
KIT ROOM TURNOVER AP CYSTO (KITS) ×3 IMPLANT
LEG HOLDER FOAM (MISCELLANEOUS) ×2
MANIFOLD NEPTUNE II (INSTRUMENTS) ×3 IMPLANT
MARKER SKIN DUAL TIP RULER LAB (MISCELLANEOUS) ×3 IMPLANT
NEEDLE HYPO 18GX1.5 BLUNT FILL (NEEDLE) ×3 IMPLANT
NEEDLE HYPO 21X1.5 SAFETY (NEEDLE) ×3 IMPLANT
NEEDLE SPNL 18GX3.5 QUINCKE PK (NEEDLE) ×3 IMPLANT
NS IRRIG 1000ML POUR BTL (IV SOLUTION) ×3 IMPLANT
PACK ARTHRO LIMB DRAPE STRL (MISCELLANEOUS) ×3 IMPLANT
PAD ABD 5X9 TENDERSORB (GAUZE/BANDAGES/DRESSINGS) ×3 IMPLANT
PAD ARMBOARD 7.5X6 YLW CONV (MISCELLANEOUS) ×3 IMPLANT
PADDING CAST COTTON 6X4 STRL (CAST SUPPLIES) ×3 IMPLANT
PADDING WEBRIL 6 STERILE (GAUZE/BANDAGES/DRESSINGS) ×3 IMPLANT
SET ARTHROSCOPY INST (INSTRUMENTS) ×3 IMPLANT
SET ARTHROSCOPY PUMP TUBE (IRRIGATION / IRRIGATOR) ×3 IMPLANT
SET BASIN LINEN APH (SET/KITS/TRAYS/PACK) ×3 IMPLANT
SPONGE GAUZE 4X4 12PLY (GAUZE/BANDAGES/DRESSINGS) ×3 IMPLANT
SUT ETHILON 3 0 FSL (SUTURE) IMPLANT
SYR 30ML LL (SYRINGE) ×3 IMPLANT
SYRINGE 10CC LL (SYRINGE) ×3 IMPLANT
WAND 50 DEG COVAC W/CORD (SURGICAL WAND) ×3 IMPLANT
WAND 90 DEG TURBOVAC W/CORD (SURGICAL WAND) IMPLANT
WAND ARTHRO PARAGON T2 (SURGICAL WAND) IMPLANT
YANKAUER SUCT BULB TIP 10FT TU (MISCELLANEOUS) ×9 IMPLANT

## 2014-07-31 NOTE — Discharge Instructions (Signed)
Change bandages tomorrow and place Band-Aids over the 2 incisions replace Ace wrap and use Cryo/Cuff 4 times a day for 30 minutes more if needed   Walk with crutches weightbearing as tolerated  Arthroscopic Procedure, Knee, Care After Refer to this sheet in the next few weeks. These discharge instructions provide you with general information on caring for yourself after you leave the hospital. Your health care provider may also give you specific instructions. Your treatment has been planned according to the most current medical practices available, but unavoidable complications sometimes occur. If you have any problems or questions after discharge, please call your health care provider. HOME CARE INSTRUCTIONS   It is normal to be sore for a couple days after surgery. See your health care provider if this seems to be getting worse rather than better.  Only take over-the-counter or prescription medicines for pain, discomfort, or fever as directed by your health care provider.  Take showers rather than baths, or as directed by your health care provider.  Change bandages (dressings) if necessary or as directed.  You may resume normal diet and activities as directed or allowed.  Avoid lifting and driving until you are directed otherwise.  Make an appointment to see your health care provider for stitches (suture) or staple removal as directed.  You may put ice on the area.  Put the ice in a plastic bag. Place a towel between your skin and the bag.  Leave the ice on for 15-20 minutes, three to four times per day for the first 2 days.  Elevate the knee above the level of your heart to reduce swelling, and avoid dangling the leg.  Do 10-15 ankle pumps (pointing your toes toward you and then away from you) two to three times daily.  If you are given compression stockings to wear after surgery, use them for as long as your surgeon tells you (around 10-14 days).  Avoid smoking and exposure to  secondhand smoke. SEEK MEDICAL CARE IF:   You have increased bleeding from your wounds.  You see redness or swelling or you have increasing pain in your wounds.  You have pus coming from your wound.  You have a fever or persistent symptoms for more than 2-3 days.  You notice a bad smell coming from the wound or dressing.  You have severe pain with any motion of your knee. SEEK IMMEDIATE MEDICAL CARE IF:   You develop a rash.  You have difficulty breathing.  You develop any reaction or side effects to medicines taken.  You develop pain in the calves or back of the knee.  You develop chest pain, shortness of breath, or difficulty breathing.  You develop numbness or tingling in the leg or foot. MAKE SURE YOU:   Understand these instructions.  Will watch your condition.  Will get help right away if you are not doing well or you get worse. Document Released: 12/30/2004 Document Revised: 06/17/2013 Document Reviewed: 11/07/2012 [facility] Patient Information 2015 [place], [place]. This information is not intended to replace advice given to you by your health care provider. Make sure you discuss any questions you have with your health care provider.

## 2014-07-31 NOTE — Anesthesia Procedure Notes (Signed)
Procedure Name: LMA Insertion Date/Time: 07/31/2014 10:29 AM Performed by: Vista Deck Pre-anesthesia Checklist: Patient identified, Patient being monitored, Emergency Drugs available, Timeout performed and Suction available Patient Re-evaluated:Patient Re-evaluated prior to inductionOxygen Delivery Method: Circle System Utilized Preoxygenation: Pre-oxygenation with 100% oxygen Intubation Type: IV induction Ventilation: Mask ventilation without difficulty LMA: LMA inserted LMA Size: 4.0 Number of attempts: 1 Placement Confirmation: positive ETCO2 and breath sounds checked- equal and bilateral Tube secured with: Tape Dental Injury: Teeth and Oropharynx as per pre-operative assessment

## 2014-07-31 NOTE — Anesthesia Postprocedure Evaluation (Signed)
  Anesthesia Post-op Note  Patient: Eugene Hill  Procedure(s) Performed: Procedure(s): KNEE ARTHROSCOPY WITH MEDIAL AND LATERAL MENISECTOMY (Right)  Patient Location: PACU  Anesthesia Type:General  Level of Consciousness: awake, alert , oriented and patient cooperative  Airway and Oxygen Therapy: Patient Spontanous Breathing  Post-op Pain: none  Post-op Assessment: Post-op Vital signs reviewed, Patient's Cardiovascular Status Stable, Respiratory Function Stable, Patent Airway, No signs of Nausea or vomiting, Pain level controlled and No headache  Post-op Vital Signs: Reviewed and stable  Last Vitals:  Filed Vitals:   07/31/14 1225  BP: 118/73  Pulse:   Temp:   Resp:     Complications: No apparent anesthesia complications

## 2014-07-31 NOTE — Op Note (Signed)
07/31/2014  11:35 AM  PATIENT:  Eugene Hill  54 y.o. male  PRE-OPERATIVE DIAGNOSIS:  medial meniscus tear right knee  POST-OPERATIVE DIAGNOSIS:  medial and lateral meniscus tear right knee, partially torn anterior cruciate ligament tear, arthritis right knee  PROCEDURE:  Procedure(s): KNEE ARTHROSCOPY WITH MEDIAL AND LATERAL MENISECTOMY (Right)  SURGEON:  Surgeon(s) and Role:    * Carole Civil, MD - Primary  PHYSICIAN ASSISTANT:   ASSISTANTS: none   ANESTHESIA:   general  EBL:  Total I/O In: 1000 [I.V.:1000] Out: 10 [Blood:10]  BLOOD ADMINISTERED:none  DRAINS: none   LOCAL MEDICATIONS USED:  MARCAINE     SPECIMEN:  No Specimen  DISPOSITION OF SPECIMEN:  N/A  COUNTS:  YES  TOURNIQUET:    DICTATION: .Dragon Dictation  PLAN OF CARE: Discharge to home after PACU  PATIENT DISPOSITION:  PACU - hemodynamically stable.   Delay start of Pharmacological VTE agent (>24hrs) due to surgical blood loss or risk of bleeding: not applicable  Details of procedure  Knee arthroscopy dictation  The patient was identified in the preoperative holding area using 2 approved identification mechanisms. The chart was reviewed and updated. The surgical site was confirmed and marked with an indelible marker.  The patient was taken to the operating room for anesthesia. After successful  general anesthesia, 2 g Ancef was used as IV antibiotics.  The patient was placed in the supine position with the operative extremity right  in an arthroscopic leg holder and the opposite extremity in a padded leg holder.  The timeout was executed.  A lateral portal was established with an 11 blade and the scope was introduced into the joint. A diagnostic arthroscopy was performed. A medial portal was established and the diagnostic arthroscopy was repeated using a probe to palpate intra-articular structures as they were encountered. The knee was evaluated circumferentially starting in the medial  compartment and ending in the medial compartment.  The operative findings #1 the medial meniscus had a bucket-handle tear The medial compartment had a grade 2 chondral lesion #2 the lateral meniscus had a mid body tear #3 the anterior cruciate ligament was torn and scarred to the PCL #4 chondromalacia was noted of the patellofemoral joint primarily in the trochlea  A combination of insurance was used to resect the bucket-handle tear. A large fragment was removed with a grasper. The remaining edges were debrided and contoured with the shaver and 50 ArthroCare wand until a stable rim was present  Excess meniscal fragments were removed with the shaver  We next addressed the lateral meniscus and residual scarring of the anterior cruciate ligament which had scarred to the anterior horn of the lateral meniscus this was resected as it was a free strand of tissue. We resected the residual meniscal tissue until a stable rim was confirmed with probing. We used the ArthroCare wand to contour the body of the meniscus laterally as well   The arthroscopic pump was placed on the wash mode and any excess debris was removed from the joint using suction.  60 cc of Marcaine with epinephrine was injected to the arthroscope.  The portals were closed with 3-0 nylon suture.  A sterile bandage, Ace wrap and Cryo/Cuff was placed and a Cryo/Cuff was activated. The patient was taken to the recovery room in stable condition.

## 2014-07-31 NOTE — Brief Op Note (Addendum)
07/31/2014  11:35 AM  PATIENT:  Eugene Hill  54 y.o. male  PRE-OPERATIVE DIAGNOSIS:  medial meniscus tear right knee  POST-OPERATIVE DIAGNOSIS:  medial and lateral meniscus tear right knee, partially torn anterior cruciate ligament tear, arthritis right knee  PROCEDURE:  Procedure(s): KNEE ARTHROSCOPY WITH MEDIAL AND LATERAL MENISECTOMY (Right)  SURGEON:  Surgeon(s) and Role:    * Carole Civil, MD - Primary  PHYSICIAN ASSISTANT:   ASSISTANTS: none   ANESTHESIA:   general  EBL:  Total I/O In: 1000 [I.V.:1000] Out: 10 [Blood:10]  BLOOD ADMINISTERED:none  DRAINS: none   LOCAL MEDICATIONS USED:  MARCAINE     SPECIMEN:  No Specimen  DISPOSITION OF SPECIMEN:  N/A  COUNTS:  YES  TOURNIQUET:    DICTATION: .Dragon Dictation  PLAN OF CARE: Discharge to home after PACU  PATIENT DISPOSITION:  PACU - hemodynamically stable.   Delay start of Pharmacological VTE agent (>24hrs) due to surgical blood loss or risk of bleeding: not applicable  Details of procedure  Knee arthroscopy dictation  The patient was identified in the preoperative holding area using 2 approved identification mechanisms. The chart was reviewed and updated. The surgical site was confirmed and marked with an indelible marker.  The patient was taken to the operating room for anesthesia. After successful  general anesthesia, 2 g Ancef was used as IV antibiotics.  The patient was placed in the supine position with the operative extremity right  in an arthroscopic leg holder and the opposite extremity in a padded leg holder.  The timeout was executed.  A lateral portal was established with an 11 blade and the scope was introduced into the joint. A diagnostic arthroscopy was performed. A medial portal was established and the diagnostic arthroscopy was repeated using a probe to palpate intra-articular structures as they were encountered. The knee was evaluated circumferentially starting in the medial  compartment and ending in the medial compartment.  The operative findings #1 the medial meniscus had a bucket-handle tear The medial compartment had a grade 2 chondral lesion #2 the lateral meniscus had a mid body tear #3 the anterior cruciate ligament was torn and scarred to the PCL #4 chondromalacia was noted of the patellofemoral joint primarily in the trochlea  A combination of insurance was used to resect the bucket-handle tear. A large fragment was removed with a grasper. The remaining edges were debrided and contoured with the shaver and 50 ArthroCare wand until a stable rim was present  Excess meniscal fragments were removed with the shaver  We next addressed the lateral meniscus and residual scarring of the anterior cruciate ligament which had scarred to the anterior horn of the lateral meniscus this was resected as it was a free strand of tissue. We resected the residual meniscal tissue until a stable rim was confirmed with probing. We used the ArthroCare wand to contour the body of the meniscus laterally as well   The arthroscopic pump was placed on the wash mode and any excess debris was removed from the joint using suction.  60 cc of Marcaine with epinephrine was injected to the arthroscope.  The portals were closed with 3-0 nylon suture.  A sterile bandage, Ace wrap and Cryo/Cuff was placed and a Cryo/Cuff was activated. The patient was taken to the recovery room in stable condition.

## 2014-07-31 NOTE — Anesthesia Preprocedure Evaluation (Signed)
Anesthesia Evaluation  Patient identified by MRN, date of birth, ID band Patient awake    Reviewed: Allergy & Precautions, NPO status , Patient's Chart, lab work & pertinent test results  Airway Mallampati: I  TM Distance: >3 FB     Dental   Pulmonary neg pulmonary ROS,  breath sounds clear to auscultation        Cardiovascular hypertension, Pt. on medications Rhythm:Regular Rate:Normal     Neuro/Psych    GI/Hepatic negative GI ROS,   Endo/Other    Renal/GU      Musculoskeletal  (+) Arthritis -,   Abdominal   Peds  Hematology   Anesthesia Other Findings   Reproductive/Obstetrics                             Anesthesia Physical Anesthesia Plan  ASA: II  Anesthesia Plan: General   Post-op Pain Management:    Induction: Intravenous  Airway Management Planned: LMA  Additional Equipment:   Intra-op Plan:   Post-operative Plan: Extubation in OR  Informed Consent: I have reviewed the patients History and Physical, chart, labs and discussed the procedure including the risks, benefits and alternatives for the proposed anesthesia with the patient or authorized representative who has indicated his/her understanding and acceptance.     Plan Discussed with:   Anesthesia Plan Comments:         Anesthesia Quick Evaluation

## 2014-07-31 NOTE — Transfer of Care (Signed)
Immediate Anesthesia Transfer of Care Note  Patient: Eugene Hill  Procedure(s) Performed: Procedure(s): KNEE ARTHROSCOPY WITH MEDIAL AND LATERAL MENISECTOMY (Right)  Patient Location: PACU  Anesthesia Type:General  Level of Consciousness: awake, oriented and patient cooperative  Airway & Oxygen Therapy: Patient Spontanous Breathing and Patient connected to face mask oxygen  Post-op Assessment: Report given to RN and Post -op Vital signs reviewed and stable  Post vital signs: Reviewed and stable  Last Vitals:  Filed Vitals:   07/31/14 1015  BP: 107/70  Pulse:   Temp:   Resp: 15    Complications: No apparent anesthesia complications

## 2014-07-31 NOTE — Interval H&P Note (Signed)
History and Physical Interval Note:  07/31/2014 9:43 AM  Eugene Hill  has presented today for surgery, with the diagnosis of medial meniscus tear right knee  The various methods of treatment have been discussed with the patient and family. After consideration of risks, benefits and other options for treatment, the patient has consented to  Procedure(s): KNEE ARTHROSCOPY WITH MEDIAL MENISECTOMY (Right) as a surgical intervention .  The patient's history has been reviewed, patient examined, no change in status, stable for surgery.  I have reviewed the patient's chart and labs.  Questions were answered to the patient's satisfaction.     Arther Abbott

## 2014-08-03 ENCOUNTER — Telehealth: Payer: Self-pay | Admitting: Orthopedic Surgery

## 2014-08-03 ENCOUNTER — Ambulatory Visit (INDEPENDENT_AMBULATORY_CARE_PROVIDER_SITE_OTHER): Payer: 59 | Admitting: Orthopedic Surgery

## 2014-08-03 ENCOUNTER — Encounter: Payer: Self-pay | Admitting: Orthopedic Surgery

## 2014-08-03 VITALS — BP 137/91 | Ht 70.0 in | Wt 244.0 lb

## 2014-08-03 DIAGNOSIS — Z9889 Other specified postprocedural states: Secondary | ICD-10-CM

## 2014-08-03 NOTE — Patient Instructions (Signed)
CALL [facility]PT TO ARRANGE THERAPY  8 WEEK WORK NOTE

## 2014-08-03 NOTE — Telephone Encounter (Signed)
Limited wage form faxed to patient's employer, [employer], to fax [phone]; authorization attached. Patient aware.

## 2014-08-03 NOTE — Progress Notes (Signed)
Chief Complaint  Patient presents with  . Follow-up    POST OP 1 SARK WITH MM, DOS 07/31/14    BP 137/91 mmHg  Ht 5\' 10"  (1.778 m)  Wt 244 lb (110.678 kg)  BMI 35.01 kg/m2  The operative findings #1 the medial meniscus had a bucket-handle tear The medial compartment had a grade 2 chondral lesion #2 the lateral meniscus had a mid body tear #3 the anterior cruciate ligament was torn and scarred to the PCL #4 chondromalacia was noted of the patellofemoral joint primarily in the trochlea  Pain is well controlled with oral pain medications. He is doing well. He is ambulatory with crutches weightbearing as tolerated. His knee flexion is about 80  Portal sites look clean  Start physical therapy return in 3 weeks with a return to work estimate of 8 weeks

## 2014-08-04 ENCOUNTER — Ambulatory Visit (HOSPITAL_COMMUNITY)
Admission: RE | Admit: 2014-08-04 | Discharge: 2014-08-04 | Disposition: A | Discharge status: Home / Self Care | Payer: 59 | Source: Ambulatory Visit | Attending: Orthopedic Surgery | Admitting: Orthopedic Surgery

## 2014-08-04 DIAGNOSIS — M25661 Stiffness of right knee, not elsewhere classified: Secondary | ICD-10-CM | POA: Diagnosis not present

## 2014-08-04 DIAGNOSIS — M6281 Muscle weakness (generalized): Secondary | ICD-10-CM | POA: Insufficient documentation

## 2014-08-04 DIAGNOSIS — Z9889 Other specified postprocedural states: Secondary | ICD-10-CM

## 2014-08-04 DIAGNOSIS — M25561 Pain in right knee: Secondary | ICD-10-CM

## 2014-08-04 DIAGNOSIS — R29898 Other symptoms and signs involving the musculoskeletal system: Secondary | ICD-10-CM

## 2014-08-04 DIAGNOSIS — R262 Difficulty in walking, not elsewhere classified: Secondary | ICD-10-CM | POA: Insufficient documentation

## 2014-08-04 NOTE — Therapy (Signed)
[place], [place], [address] Phone: [phone]   Fax:  [phone]  Physical Therapy Evaluation  Patient Details  Name: Eugene Hill MRN: [id number] Date of Birth: 1961/05/02 Referring Provider:  Carole Civil, MD  Encounter Date: 08/04/2014    Past Medical History  Diagnosis Date  . Hypertension   . Arthritis     Past Surgical History  Procedure Laterality Date  . Right knee arthroscopy  1987  . Left broken arm    . Neck surgery      cervical c4- c5  . Colonoscopy N/A 08/15/2013    Procedure: COLONOSCOPY;  Surgeon: Danie Binder, MD;  [facility];  Service: Endoscopy;  Laterality: N/A;  9:15 AM  . Knee arthroscopy with medial menisectomy Right 07/31/2014    Procedure: KNEE ARTHROSCOPY WITH MEDIAL AND LATERAL MENISECTOMY;  Surgeon: Carole Civil, MD;  Location: [facility];  Service: Orthopedics;  Laterality: Right;    There were no vitals taken for this visit.  Visit Diagnosis:  S/P right knee arthroscopy  Right knee pain  Weakness of right hip  Knee stiffness, right      Subjective Assessment - 08/04/14 1350    Symptoms Rt knee pain and stiffness with bending and straightening.    Pertinent History 07/31/14 Rt knee arthroscopy by doctor Aline Brochure. Scope performed for arthrotis and torn meniscus. Patient WBAT.    Limitations Walking;Lifting   How long can you walk comfortably? Able ot walk with one crutch without pain.    Patient Stated Goals To be able to walk, climb stairs/ladders, crawl and squat better without pain. To be able to run and play softball and basketball.    Currently in Pain? Yes   Pain Score 2    Pain Location Knee   Pain Orientation Right   Pain Descriptors / Indicators Aching   Pain Type Surgical pain   Pain Onset In the past 7 days   Pain Frequency Intermittent   Aggravating Factors  bending the knee,    Pain Relieving Factors ice, colw water wrap.            [facility] PT Assessment - 08/04/14 0001    Assessment   Medical Diagnosis Rt knee stiffness s/p Rt knee arthroscopy.    Onset Date 07/31/14   Next MD Visit [name], 08/24/14   Prior Therapy no   Balance Screen   Has the patient fallen in the past 6 months No   Has the patient had a decrease in activity level because of a fear of falling?  No   Is the patient reluctant to leave their home because of a fear of falling?  No   Prior Function   Level of Independence Independent with basic ADLs   Other:   Other/ Comments Gait: limited toe in, good toe ou, limited pelvic rotation, early heel rise, limited Rt knee extension.    AROM   Right Hip External Rotation  45   Right Hip Internal Rotation  18   Right Knee Extension -2   Right Knee Flexion 93   Right Ankle Dorsiflexion 5   Strength   Right Hip Flexion 5/5   Right Hip Extension 4+/5   Right Hip ABduction 4-/5   Left Hip Flexion 5/5   Left Hip Extension 5/5   Left Hip ABduction 4/5   Right Knee Flexion 5/5   Right Knee Extension 4-/5   Left Knee Flexion 5/5  Left Knee Extension 5/5   Right Ankle Dorsiflexion 5/5   Left Ankle Dorsiflexion 5/5   Flexibility   Soft Tissue Assessment /Muscle Lenght yes   Hamstrings 60 degrees Rt SLR                  [facility] Adult PT Treatment/Exercise - 08/04/14 0001    Knee/Hip Exercises: Stretches   Active Hamstring Stretch 2 reps;20 seconds   Hip Flexor Stretch 2 reps;20 seconds   Gastroc Stretch 2 reps;20 seconds                PT Education - 08/04/14 1433    Education provided Yes   Education Details HEP: hamstring, hip flexor and calf stretch 2x 20 seconds each twice daily   Person(s) Educated Patient   Methods Explanation;Demonstration;Handout   Comprehension Verbalized understanding;Returned demonstration          PT Short Term Goals - 08/04/14 1620    PT SHORT TERM GOAL #1   Title Patient will be able to fully straighten Rt knee to be able to  normalize stride length.    Baseline -3 degrees   Time 4   Period Weeks   Status New   PT SHORT TERM GOAL #2   Title Patient will be able to Flex Rt knee to 120 degrees to be able to squat to a chair without compensatign with weight shift to Lt LE.    Baseline 92 degrees   Time 4   Period Weeks   Status New   PT SHORT TERM GOAL #3   Title Patient will dmeosntrate Rt quadricep strength of 4/5 MMT to be bale to go up and down stairs with 1 HHA.    Time 4   Period Weeks   Status New   PT SHORT TERM GOAL #4   Title Patient will be independent with HEP.    Time 4   Period Weeks   Status New   PT SHORT TERM GOAL #5   Title patient will dmeosntrate increased hip internal rotation bilaterally to > 30 degrees.    Time 4   Period Weeks   Status New           PT Long Term Goals - 08/04/14 1851    PT LONG TERM GOAL #1   Title Patient will be able to Flex Rt knee to 130 degrees to be able to kneel to the floor.    Time 8   Period Weeks   Status New   PT LONG TERM GOAL #2   Title Patient will dmeosntrate Rt quadricep strength of 5/5 MMT to be able to lift 50lb from the floor for work   Time 8   Period Weeks   Status New   PT LONG TERM GOAL #3   Title Patient will dmeonstrate increased hip abductionstrength of 4/5 MMT to increase hip/knee stability during gait.    Time 8   Period Weeks   Status New   PT LONG TERM GOAL #4   Title patient will be able to walk up and down stairs, ladders and on flat surfaces ad lib withtou pain.    Time 8   Period Weeks   Status New               Plan - 08/04/14 1447    Clinical Impression Statement Patient displasy Rt knee stiffness and weakness s/p arthropscopic surgery to repain meniscus.  In addition patient displays hip stiffness and quadriceps/glut weakness resulting  in limited knee stability. Patient will benefit from skilled physical therapy to increased knee AROM and stability so patient can return to work which requiires him to  be bale to go up and down ladders, kneel , crawl, and lift hevy obects from the floor.    Pt will benefit from skilled therapeutic intervention in order to improve on the following deficits Abnormal gait;Decreased endurance;Improper body mechanics;Decreased scar mobility;Decreased strength;Impaired flexibility;Difficulty walking;Decreased mobility;Pain;Decreased range of motion;Increased fascial restricitons   Rehab Potential Good   PT Frequency 2x / week  3x a week for initial 3 weeks   PT Duration 8 weeks   PT Treatment/Interventions Stair training;Gait training;Functional mobility training;Patient/family education;Passive range of motion;Therapeutic activities;Therapeutic exercise;Manual techniques;Balance training   PT Next Visit Plan Introduce queadriceps stretch, groin strech and priformis stretch, standing 3D hip excursion, 3 way knee triber to 12" box, progress stretches to 3 way   PT Home Exercise Plan standing hamstring, calf and hip flexor stretches, add quadriceps stretch next session.    Consulted and Agree with Plan of Care Patient         Problem List Patient Active Problem List   Diagnosis Date Noted  . Lateral meniscal tear   . Acute medial meniscal tear   . Arthritis of knee, right   . Torn medial meniscus 07/20/2014  . Anterior cruciate ligament tear 07/20/2014  . Primary osteoarthritis of right knee 07/20/2014    Devona Konig PT DPT [place], [place], [address] Phone: [phone]   Fax:  [phone]

## 2014-08-06 ENCOUNTER — Ambulatory Visit (HOSPITAL_COMMUNITY)
Admission: RE | Admit: 2014-08-06 | Discharge: 2014-08-06 | Disposition: A | Discharge status: Home / Self Care | Payer: 59 | Source: Ambulatory Visit | Attending: Orthopedic Surgery | Admitting: Orthopedic Surgery

## 2014-08-06 DIAGNOSIS — R29898 Other symptoms and signs involving the musculoskeletal system: Secondary | ICD-10-CM

## 2014-08-06 DIAGNOSIS — M25561 Pain in right knee: Secondary | ICD-10-CM | POA: Diagnosis not present

## 2014-08-06 DIAGNOSIS — Z9889 Other specified postprocedural states: Secondary | ICD-10-CM

## 2014-08-06 DIAGNOSIS — M25661 Stiffness of right knee, not elsewhere classified: Secondary | ICD-10-CM

## 2014-08-06 NOTE — Therapy (Signed)
[place], [place], [address] Phone: [phone]   Fax:  [phone]  Physical Therapy Treatment  Patient Details  Name: Eugene Hill MRN: [id number] Date of Birth: 1961-06-10 Referring Provider:  Carole Civil, MD  Encounter Date: 08/06/2014      PT End of Session - 08/06/14 0902    Visit Number 2   Number of Visits 18   Date for PT Re-Evaluation 09/05/14   Authorization Type UHC   Authorization - Visit Number 2   Authorization - Number of Visits 18   PT Start Time [phone]   PT Stop Time 0925   PT Time Calculation (min) 47 min   Activity Tolerance Patient tolerated treatment well   Behavior During Therapy [facility] for tasks assessed/performed      Past Medical History  Diagnosis Date  . Hypertension   . Arthritis     Past Surgical History  Procedure Laterality Date  . Right knee arthroscopy  1987  . Left broken arm    . Neck surgery      cervical c4- c5  . Colonoscopy N/A 08/15/2013    Procedure: COLONOSCOPY;  Surgeon: Danie Binder, MD;  [facility];  Service: Endoscopy;  Laterality: N/A;  9:15 AM  . Knee arthroscopy with medial menisectomy Right 07/31/2014    Procedure: KNEE ARTHROSCOPY WITH MEDIAL AND LATERAL MENISECTOMY;  Surgeon: Carole Civil, MD;  Location: [facility];  Service: Orthopedics;  Laterality: Right;    There were no vitals taken for this visit.  Visit Diagnosis:  S/P right knee arthroscopy  Right knee pain  Weakness of right hip  Knee stiffness, right      Subjective Assessment - 08/06/14 0841    Symptoms PAin noted at 3/10 Patient states occasional sharp pains in lateral aspect of knee.    Currently in Pain? Yes   Pain Score 3    Pain Location Knee   Pain Orientation Right           [facility] Adult PT Treatment/Exercise - 08/06/14 0001    Knee/Hip Exercises: Stretches   Active Hamstring Stretch 2 reps;20 seconds   Active Hamstring Stretch Limitations  3 way to 12"    Quad Stretch 2 reps;20 seconds   Hip Flexor Stretch 2 reps;20 seconds   Hip Flexor Stretch Limitations 3 way to 12"    Knee: Self-Stretch Limitations Groin stretch 2x 20 seconds to 12" 2 way   ITB Stretch 2 reps;20 seconds   ITB Stretch Limitations 8" box   Piriformis Stretch 2 reps;20 seconds   Piriformis Stretch Limitations seated   Gastroc Stretch 2 reps;20 seconds   Gastroc Stretch Limitations 3 way   Knee/Hip Exercises: Standing   Forward Lunges 5 reps;3 sets   Forward Lunges Limitations 3 way reach to 6"    Functional Squat Limitations mini squats on Rt with lt LE toe touch.  10x   Other Standing Knee Exercises 3D hip excursions 10x   Manual Therapy   Manual Therapy Joint mobilization   Joint Mobilization grade 3 [facility] and PE joint mobilizations, Lunge to 6" on Lt with functional manual reactions to Rt knee to increase extension           PT Education - 08/06/14 0859    Education Details HEP updated with quadriceps, piriformis, and ITband steretches   Person(s) Educated Patient   Methods Explanation;Demonstration;Handout   Comprehension Verbalized understanding;Returned demonstration  PT Short Term Goals - 08/04/14 1620    PT SHORT TERM GOAL #1   Title Patient will be able to fully straighten Rt knee to be able to normalize stride length.    Baseline -3 degrees   Time 4   Period Weeks   Status New   PT SHORT TERM GOAL #2   Title Patient will be able to Flex Rt knee to 120 degrees to be able to squat to a chair without compensatign with weight shift to Lt LE.    Baseline 92 degrees   Time 4   Period Weeks   Status New   PT SHORT TERM GOAL #3   Title Patient will dmeosntrate Rt quadricep strength of 4/5 MMT to be bale to go up and down stairs with 1 HHA.    Time 4   Period Weeks   Status New   PT SHORT TERM GOAL #4   Title Patient will be independent with HEP.    Time 4   Period Weeks   Status New   PT SHORT TERM GOAL #5   Title  patient will dmeosntrate increased hip internal rotation bilaterally to > 30 degrees.    Time 4   Period Weeks   Status New           PT Long Term Goals - 08/04/14 1851    PT LONG TERM GOAL #1   Title Patient will be able to Flex Rt knee to 130 degrees to be able to kneel to the floor.    Time 8   Period Weeks   Status New   PT LONG TERM GOAL #2   Title Patient will dmeosntrate Rt quadricep strength of 5/5 MMT to be able to lift 50lb from the floor for work   Time 8   Period Weeks   Status New   PT LONG TERM GOAL #3   Title Patient will dmeonstrate increased hip abductionstrength of 4/5 MMT to increase hip/knee stability during gait.    Time 8   Period Weeks   Status New   PT LONG TERM GOAL #4   Title patient will be able to walk up and down stairs, ladders and on flat surfaces ad lib withtou pain.    Time 8   Period Weeks   Status New               Plan - 08/06/14 9211    Clinical Impression Statement All stretches given. patient demosntrates Rt glut/tensor fascia latae tightness resulting in lateral deviation of knee during lunges. following stretches paqtient noted decreased pain and improved abiliy to lunge with out lateral deviaion of Rt knee.  limited terminal knee extension durign gait.    PT Next Visit Plan Introduce split stance 3D hip excursions, and continue manual techniques to increase knee extension durign terminal gait.         Problem List Patient Active Problem List   Diagnosis Date Noted  . Lateral meniscal tear   . Acute medial meniscal tear   . Arthritis of knee, right   . Torn medial meniscus 07/20/2014  . Anterior cruciate ligament tear 07/20/2014  . Primary osteoarthritis of right knee 07/20/2014   Devona Konig PT DPT [place], [place], [address] Phone: [phone]   Fax:  [phone]

## 2014-08-06 NOTE — Patient Instructions (Signed)
Flexion: Stretch - Quadriceps (Prone)   There should be no pain in knee joint. Hold 20 seconds. Repeat 4 times. Repeat with other leg. Do 3 sessions per day.

## 2014-08-12 ENCOUNTER — Ambulatory Visit (HOSPITAL_COMMUNITY): Payer: 59 | Admitting: Physical Therapy

## 2014-08-12 DIAGNOSIS — M25561 Pain in right knee: Secondary | ICD-10-CM | POA: Diagnosis not present

## 2014-08-12 DIAGNOSIS — Z9889 Other specified postprocedural states: Secondary | ICD-10-CM

## 2014-08-12 DIAGNOSIS — M25661 Stiffness of right knee, not elsewhere classified: Secondary | ICD-10-CM

## 2014-08-12 DIAGNOSIS — R29898 Other symptoms and signs involving the musculoskeletal system: Secondary | ICD-10-CM

## 2014-08-12 NOTE — Therapy (Signed)
[place], [place], [address] Phone: [phone]   Fax:  [phone]  Physical Therapy Treatment  Patient Details  Name: Eugene Hill MRN: [id number] Date of Birth: 08/21/1960 Referring Provider:  Carole Civil, MD  Encounter Date: 08/12/2014      PT End of Session - 08/12/14 0924    Visit Number 3   Number of Visits 18   Date for PT Re-Evaluation 09/05/14   Authorization Type UHC   Authorization - Visit Number 3   Authorization - Number of Visits 18   PT Start Time 0845   PT Stop Time 0927   PT Time Calculation (min) 42 min   Activity Tolerance Patient tolerated treatment well   Behavior During Therapy [facility] for tasks assessed/performed      Past Medical History  Diagnosis Date  . Hypertension   . Arthritis     Past Surgical History  Procedure Laterality Date  . Right knee arthroscopy  1987  . Left broken arm    . Neck surgery      cervical c4- c5  . Colonoscopy N/A 08/15/2013    Procedure: COLONOSCOPY;  Surgeon: Danie Binder, MD;  [facility];  Service: Endoscopy;  Laterality: N/A;  9:15 AM  . Knee arthroscopy with medial menisectomy Right 07/31/2014    Procedure: KNEE ARTHROSCOPY WITH MEDIAL AND LATERAL MENISECTOMY;  Surgeon: Carole Civil, MD;  Location: [facility];  Service: Orthopedics;  Laterality: Right;    There were no vitals taken for this visit.  Visit Diagnosis:  S/P right knee arthroscopy  Right knee pain  Weakness of right hip  Knee stiffness, right      Subjective Assessment - 08/12/14 0845    Symptoms Patient very pleasant this morning, pain 1/10   Pertinent History 07/31/14 Rt knee arthroscopy by doctor Aline Brochure. Scope performed for arthrotis and torn meniscus. Patient WBAT.    Currently in Pain? Yes   Pain Score 1    Pain Location Knee   Pain Orientation Right                    [facility] Adult PT Treatment/Exercise - 08/12/14 0001    Knee/Hip Exercises: Stretches   Active Hamstring Stretch 3 reps;30 seconds   Active Hamstring Stretch Limitations 3 way, 14 inch box   Quad Stretch 2 reps;30 seconds   Quad Stretch Limitations prone with rope   Piriformis Stretch 2 reps;30 seconds   Piriformis Stretch Limitations seated   Gastroc Stretch 3 reps;30 seconds   Gastroc Stretch Limitations slantboard   Knee/Hip Exercises: Standing   Heel Raises 1 set;10 reps   Heel Raises Limitations off slant board   Forward Lunges 10 reps;2 sets   Forward Lunges Limitations reach to floor, 6" box   Functional Squat Limitations mini squats on Rt with lt LE toe touch.  10x   Stairs ascent/descent 8 inch steps x5 with no rails, reciprocal steps   Other Standing Knee Exercises 3D hip excursions in split stance; sit to stand with full PF and knee extension; standing TKE with green therband   Other Standing Knee Exercises Attempted star reaches with small amount R knee flexion- terminated due to increased pain R knee   Knee/Hip Exercises: Supine   Bridges Both;2 sets;10 reps   Straight Leg Raises Right;2 sets;10 reps   Other Supine Knee Exercises prone hip extensions 1x10 R   Knee/Hip Exercises: Sidelying   Hip ABduction  10 reps;Right;1 set                  PT Short Term Goals - 08/04/14 1620    PT SHORT TERM GOAL #1   Title Patient will be able to fully straighten Rt knee to be able to normalize stride length.    Baseline -3 degrees   Time 4   Period Weeks   Status New   PT SHORT TERM GOAL #2   Title Patient will be able to Flex Rt knee to 120 degrees to be able to squat to a chair without compensatign with weight shift to Lt LE.    Baseline 92 degrees   Time 4   Period Weeks   Status New   PT SHORT TERM GOAL #3   Title Patient will dmeosntrate Rt quadricep strength of 4/5 MMT to be bale to go up and down stairs with 1 HHA.    Time 4   Period Weeks   Status New   PT SHORT TERM GOAL #4   Title Patient will be  independent with HEP.    Time 4   Period Weeks   Status New   PT SHORT TERM GOAL #5   Title patient will dmeosntrate increased hip internal rotation bilaterally to > 30 degrees.    Time 4   Period Weeks   Status New           PT Long Term Goals - 08/04/14 1851    PT LONG TERM GOAL #1   Title Patient will be able to Flex Rt knee to 130 degrees to be able to kneel to the floor.    Time 8   Period Weeks   Status New   PT LONG TERM GOAL #2   Title Patient will dmeosntrate Rt quadricep strength of 5/5 MMT to be able to lift 50lb from the floor for work   Time 8   Period Weeks   Status New   PT LONG TERM GOAL #3   Title Patient will dmeonstrate increased hip abductionstrength of 4/5 MMT to increase hip/knee stability during gait.    Time 8   Period Weeks   Status New   PT LONG TERM GOAL #4   Title patient will be able to walk up and down stairs, ladders and on flat surfaces ad lib withtou pain.    Time 8   Period Weeks   Status New               Plan - 08/12/14 3664    Clinical Impression Statement Patient participated well in skilled session, however continues to display overall soft tissue tightness R leg especially IT band and quads. Patient did c/o of feeling like the knee was instable, educated this was possibly due to general weakness and impaired proprioception post surgery. Continues to display limited TKE during functional tasks, attempted to address with activities incorporating TKE. Weakness noted hip extensors in gravity present position as well as groin tightness during sidelying hip ABD.    Pt will benefit from skilled therapeutic intervention in order to improve on the following deficits Abnormal gait;Decreased endurance;Improper body mechanics;Decreased scar mobility;Decreased strength;Impaired flexibility;Difficulty walking;Decreased mobility;Pain;Decreased range of motion;Increased fascial restricitons   PT Frequency 2x / week   PT Duration 8 weeks   PT  Treatment/Interventions Stair training;Gait training;Functional mobility training;Patient/family education;Passive range of motion;Therapeutic activities;Therapeutic exercise;Manual techniques;Balance training   PT Next Visit Plan Continue functional stretching with focus on quads, IT, groin; increase R  knee TKE; address hip ABD and extension in gravity present positions; balance work; continue split stance 3D hip excursions        Problem List Patient Active Problem List   Diagnosis Date Noted  . Lateral meniscal tear   . Acute medial meniscal tear   . Arthritis of knee, right   . Torn medial meniscus 07/20/2014  . Anterior cruciate ligament tear 07/20/2014  . Primary osteoarthritis of right knee 07/20/2014    Deniece Ree PT, DPT [place] [address] [place], [place], [address] Phone: [phone]   Fax:  [phone]

## 2014-08-14 ENCOUNTER — Ambulatory Visit (HOSPITAL_COMMUNITY): Payer: 59

## 2014-08-14 DIAGNOSIS — M25661 Stiffness of right knee, not elsewhere classified: Secondary | ICD-10-CM

## 2014-08-14 DIAGNOSIS — M25561 Pain in right knee: Secondary | ICD-10-CM

## 2014-08-14 DIAGNOSIS — R29898 Other symptoms and signs involving the musculoskeletal system: Secondary | ICD-10-CM

## 2014-08-14 DIAGNOSIS — Z9889 Other specified postprocedural states: Secondary | ICD-10-CM

## 2014-08-14 NOTE — Therapy (Signed)
[place], [place], [address] Phone: [phone]   Fax:  [phone]  Physical Therapy Treatment  Patient Details  Name: Eugene Hill MRN: [id number] Date of Birth: 09-29-1960 Referring Provider:  Delphina Cahill, MD  Encounter Date: 08/14/2014      PT End of Session - 08/14/14 0928    Visit Number 4   Number of Visits 18   Date for PT Re-Evaluation 09/05/14   Authorization Type UHC   Authorization - Visit Number 4   Authorization - Number of Visits 18   PT Start Time 0845   PT Stop Time 0935   PT Time Calculation (min) 50 min   Activity Tolerance Patient tolerated treatment well   Behavior During Therapy [facility] for tasks assessed/performed      Past Medical History  Diagnosis Date  . Hypertension   . Arthritis     Past Surgical History  Procedure Laterality Date  . Right knee arthroscopy  1987  . Left broken arm    . Neck surgery      cervical c4- c5  . Colonoscopy N/A 08/15/2013    Procedure: COLONOSCOPY;  Surgeon: Danie Binder, MD;  [facility];  Service: Endoscopy;  Laterality: N/A;  9:15 AM  . Knee arthroscopy with medial menisectomy Right 07/31/2014    Procedure: KNEE ARTHROSCOPY WITH MEDIAL AND LATERAL MENISECTOMY;  Surgeon: Carole Civil, MD;  Location: [facility];  Service: Orthopedics;  Laterality: Right;    There were no vitals taken for this visit.  Visit Diagnosis:  S/P right knee arthroscopy  Right knee pain  Weakness of right hip  Knee stiffness, right      Subjective Assessment - 08/14/14 0847    Symptoms Pt stated he feels like he may have done too much yesterday, pain scale 2/10 distal to patella.  Compliant with HEP daily   Currently in Pain? Yes   Pain Score 2    Pain Location Knee   Pain Orientation Right   Pain Descriptors / Indicators Aching          [facility] PT Assessment - 08/14/14 0001    Assessment   Medical Diagnosis Rt knee stiffness s/p Rt knee  arthroscopy.    Onset Date 07/31/14   Next MD Visit [name], 08/24/14   Prior Therapy no   AROM   Right Knee Extension 0   Right Knee Flexion 122                  [facility] Adult PT Treatment/Exercise - 08/14/14 0848    Exercises   Exercises Knee/Hip   Knee/Hip Exercises: Stretches   Active Hamstring Stretch 3 reps;30 seconds   Active Hamstring Stretch Limitations 3 way, 14 inch box   Quad Stretch 3 reps;30 seconds   Quad Stretch Limitations prone with rope   Hip Flexor Stretch 3 reps;20 seconds   Hip Flexor Stretch Limitations Groin stretch on 14in box with ER   Knee: Self-Stretch Limitations knee drives on 8 in box   ITB Stretch 2 reps;30 seconds   ITB Stretch Limitations 6 in box   Piriformis Stretch 2 reps;30 seconds   Piriformis Stretch Limitations supine figure 4   Gastroc Stretch 3 reps;30 seconds   Gastroc Stretch Limitations slantboard   Knee/Hip Exercises: Standing   Forward Lunges 10 reps;2 sets   Forward Lunges Limitations reach to floor, 6" box   Side Lunges 10 reps   Side Lunges Limitations  6 in step   Terminal Knee Extension Right;10 reps;Theraband   Theraband Level (Terminal Knee Extension) Level 3 (Green)   Lateral Step Up Right;10 reps;Hand Hold: 2;Step Height: 6"   Forward Step Up Right;10 reps;Hand Hold: 1;Step Height: 6"   Functional Squat 2 sets;10 reps   Functional Squat Limitations 3D hip excursion split stance   Other Standing Knee Exercises Sidestepping green tband 30 feet   Knee/Hip Exercises: Supine   Patellar Mobs Insructed and complete                PT Education - 08/14/14 1339    Education provided Yes   Education Details Blue tband given for TKE, Educated on patella mobs   Person(s) Educated Patient   Methods Explanation;Demonstration;Handout   Comprehension Returned demonstration          PT Short Term Goals - 08/14/14 0858    PT SHORT TERM GOAL #1   Title Patient will be able to fully straighten Rt knee to be  able to normalize stride length.    Baseline 0-122 degrees on 08/14/14   Status Achieved   PT SHORT TERM GOAL #2   Title Patient will be able to Flex Rt knee to 120 degrees to be able to squat to a chair without compensatign with weight shift to Lt LE.    Baseline 0-122 degrees on 08/14/14   Status Achieved   PT SHORT TERM GOAL #3   Title Patient will dmeosntrate Rt quadricep strength of 4/5 MMT to be bale to go up and down stairs with 1 HHA.    Status On-going   PT SHORT TERM GOAL #4   Title Patient will be independent with HEP.    Status On-going   PT SHORT TERM GOAL #5   Title patient will dmeosntrate increased hip internal rotation bilaterally to > 30 degrees.    Status On-going           PT Long Term Goals - 08/14/14 7121    PT LONG TERM GOAL #1   Title Patient will be able to Flex Rt knee to 130 degrees to be able to kneel to the floor.    PT LONG TERM GOAL #2   Title Patient will dmeosntrate Rt quadricep strength of 5/5 MMT to be able to lift 50lb from the floor for work   PT LONG TERM GOAL #3   Title Patient will dmeonstrate increased hip abductionstrength of 4/5 MMT to increase hip/knee stability during gait.    PT LONG TERM GOAL #4   Title patient will be able to walk up and down stairs, ladders and on flat surfaces ad lib withtou pain.                Plan - 08/14/14 0958    Clinical Impression Statement Vast improviments with AROM 0-122 degrees, 2 STGs met this session.  Continued with stretches to improve flexibilty and progress functional strengthening exercises with stair training and gluteal strengtheing.  Pt given theraband for TKE HEP to improve quad strengthening with knee extension and education on patellar mobilty.  Noted restrictiopns with proximal/distal mobilty and noted fluid on anterior knee.  Pt reported pain reduced at end of session.  Encouraged pt to apply ice with elevation for pain and edema control at gine    PT Next Visit Plan Continue  functional stretching with focus on quads, IT, groin; increase R knee TKE; address hip ABD and extension in gravity present positions; balance work; continue split  stance 3D hip excursions   PT Home Exercise Plan Given TKE blue tband and instructed patella mobs.        Problem List Patient Active Problem List   Diagnosis Date Noted  . Lateral meniscal tear   . Acute medial meniscal tear   . Arthritis of knee, right   . Torn medial meniscus 07/20/2014  . Anterior cruciate ligament tear 07/20/2014  . Primary osteoarthritis of right knee 07/20/2014   Ihor Austin, [place]  Aldona Lento 08/14/2014, 1:42 PM  [place], [place], [address] Phone: [phone]   Fax:  [phone]

## 2014-08-17 ENCOUNTER — Ambulatory Visit (HOSPITAL_COMMUNITY): Payer: 59 | Admitting: Physical Therapy

## 2014-08-17 DIAGNOSIS — Z9889 Other specified postprocedural states: Secondary | ICD-10-CM

## 2014-08-17 DIAGNOSIS — R29898 Other symptoms and signs involving the musculoskeletal system: Secondary | ICD-10-CM

## 2014-08-17 DIAGNOSIS — M25661 Stiffness of right knee, not elsewhere classified: Secondary | ICD-10-CM

## 2014-08-17 DIAGNOSIS — M25561 Pain in right knee: Secondary | ICD-10-CM | POA: Diagnosis not present

## 2014-08-17 NOTE — Therapy (Signed)
[place], [place], [address] Phone: [phone]   Fax:  [phone]  Physical Therapy Treatment  Patient Details  Name: Eugene Hill MRN: [id number] Date of Birth: 01-Nov-1960 Referring Provider:  Carole Civil, MD  Encounter Date: 08/17/2014      PT End of Session - 08/17/14 0920    Visit Number 5   Number of Visits 18   Date for PT Re-Evaluation 08/19/14   Authorization Type UHC   Authorization - Visit Number 5   Authorization - Number of Visits 18   PT Start Time 0845   PT Stop Time 0927   PT Time Calculation (min) 42 min   Activity Tolerance Patient tolerated treatment well   Behavior During Therapy [facility] for tasks assessed/performed      Past Medical History  Diagnosis Date  . Hypertension   . Arthritis     Past Surgical History  Procedure Laterality Date  . Right knee arthroscopy  1987  . Left broken arm    . Neck surgery      cervical c4- c5  . Colonoscopy N/A 08/15/2013    Procedure: COLONOSCOPY;  Surgeon: Danie Binder, MD;  [facility];  Service: Endoscopy;  Laterality: N/A;  9:15 AM  . Knee arthroscopy with medial menisectomy Right 07/31/2014    Procedure: KNEE ARTHROSCOPY WITH MEDIAL AND LATERAL MENISECTOMY;  Surgeon: Carole Civil, MD;  Location: [facility];  Service: Orthopedics;  Laterality: Right;    There were no vitals taken for this visit.  Visit Diagnosis:  S/P right knee arthroscopy  Right knee pain  Weakness of right hip  Knee stiffness, right      Subjective Assessment - 08/17/14 0851    Symptoms Patient states that his R knee really starts to feel loose and unstable after quad stretch in particular, continues to feel like he is having difficulty with R quad strength in TKE; states MD appointment next week   Pertinent History 07/31/14 Rt knee arthroscopy by doctor Aline Brochure. Scope performed for arthrotis and torn meniscus. Patient WBAT.    Currently in  Pain? Yes   Pain Score 1    Pain Location Knee   Pain Orientation Right                    [facility] Adult PT Treatment/Exercise - 08/17/14 0001    Knee/Hip Exercises: Stretches   Active Hamstring Stretch 3 reps;30 seconds   Active Hamstring Stretch Limitations 3 way, 14 inch box   Quad Stretch 30 seconds;2 reps   Quad Stretch Limitations prone with rope   ITB Stretch 2 reps;30 seconds   ITB Stretch Limitations LE cross-over against wall   Piriformis Stretch 2 reps;30 seconds   Piriformis Stretch Limitations seated   Gastroc Stretch 3 reps;30 seconds   Gastroc Stretch Limitations slantboard   Knee/Hip Exercises: Standing   Functional Squat 2 sets;10 reps   Functional Squat Limitations Mini-squats with focus on R knee TKE   Other Standing Knee Exercises 3D hip excustions split stance; heel-toe gait 2x104ft and SLS on foam pad 3x10 seconds both sides   Knee/Hip Exercises: Sidelying   Hip ABduction AROM;Both;10 reps;1 set   Hip ABduction Limitations manual facilitation for form   Knee/Hip Exercises: Prone   Hip Extension AROM;10 reps;2 sets   Hip Extension Limitations manual facilitation for form  PT Short Term Goals - 08/14/14 3295    PT SHORT TERM GOAL #1   Title Patient will be able to fully straighten Rt knee to be able to normalize stride length.    Baseline 0-122 degrees on 08/14/14   Status Achieved   PT SHORT TERM GOAL #2   Title Patient will be able to Flex Rt knee to 120 degrees to be able to squat to a chair without compensatign with weight shift to Lt LE.    Baseline 0-122 degrees on 08/14/14   Status Achieved   PT SHORT TERM GOAL #3   Title Patient will dmeosntrate Rt quadricep strength of 4/5 MMT to be bale to go up and down stairs with 1 HHA.    Status On-going   PT SHORT TERM GOAL #4   Title Patient will be independent with HEP.    Status On-going   PT SHORT TERM GOAL #5   Title patient will dmeosntrate increased hip  internal rotation bilaterally to > 30 degrees.    Status On-going           PT Long Term Goals - 08/14/14 1884    PT LONG TERM GOAL #1   Title Patient will be able to Flex Rt knee to 130 degrees to be able to kneel to the floor.    PT LONG TERM GOAL #2   Title Patient will dmeosntrate Rt quadricep strength of 5/5 MMT to be able to lift 50lb from the floor for work   PT LONG TERM GOAL #3   Title Patient will dmeonstrate increased hip abductionstrength of 4/5 MMT to increase hip/knee stability during gait.    PT LONG TERM GOAL #4   Title patient will be able to walk up and down stairs, ladders and on flat surfaces ad lib withtou pain.                Plan - 08/17/14 1028    Clinical Impression Statement Patient continues to c/o of feeling that his R knee starts to feel unstable after quad stretches, and also states that his right knee continues to feel weak after squats and flexion exercises. Unable to perform prone extensions without hamstring cramping with knee bent, but able to do so with knee straight, indicating possible continued hamstring weakness. Edema noted anterior knee, patient states that he often feels like there is pressure in that area. Encouraged to continued icing protocol at home.   Pt will benefit from skilled therapeutic intervention in order to improve on the following deficits Abnormal gait;Decreased endurance;Improper body mechanics;Decreased scar mobility;Decreased strength;Impaired flexibility;Difficulty walking;Decreased mobility;Pain;Decreased range of motion;Increased fascial restricitons   Rehab Potential Good   PT Frequency 2x / week   PT Duration 8 weeks   PT Treatment/Interventions Stair training;Gait training;Functional mobility training;Patient/family education;Passive range of motion;Therapeutic activities;Therapeutic exercise;Manual techniques;Balance training   PT Next Visit Plan Functional stretches with focus on quads, ITB, groin; improve R quad  strength especially in end range of TKE; continue focus on hip ABD/extension with gravity present; functional balance exercises   Consulted and Agree with Plan of Care Patient        Problem List Patient Active Problem List   Diagnosis Date Noted  . Lateral meniscal tear   . Acute medial meniscal tear   . Arthritis of knee, right   . Torn medial meniscus 07/20/2014  . Anterior cruciate ligament tear 07/20/2014  . Primary osteoarthritis of right knee 07/20/2014    Deniece Ree PT, DPT [phone]  [place], [place], [address] Phone: [phone]   Fax:  [phone]

## 2014-08-19 ENCOUNTER — Ambulatory Visit (HOSPITAL_COMMUNITY): Payer: 59 | Admitting: Physical Therapy

## 2014-08-19 DIAGNOSIS — M25561 Pain in right knee: Secondary | ICD-10-CM | POA: Diagnosis not present

## 2014-08-19 DIAGNOSIS — M25661 Stiffness of right knee, not elsewhere classified: Secondary | ICD-10-CM

## 2014-08-19 DIAGNOSIS — R29898 Other symptoms and signs involving the musculoskeletal system: Secondary | ICD-10-CM

## 2014-08-19 DIAGNOSIS — Z9889 Other specified postprocedural states: Secondary | ICD-10-CM

## 2014-08-19 NOTE — Therapy (Signed)
[place], [place], [address] Phone: [phone]   Fax:  [phone]  Physical Therapy Treatment  Patient Details  Name: Eugene Hill MRN: [id number] Date of Birth: [birth year] Referring Provider:  Carole Civil, MD  Encounter Date: 08/19/2014      PT End of Session - 08/19/14 1200    Visit Number 6   Number of Visits 18   Date for PT Re-Evaluation 09/09/14   Authorization Type UHC   Authorization - Visit Number 6   Authorization - Number of Visits 18   PT Start Time 1016   PT Stop Time 1058   PT Time Calculation (min) 42 min   Activity Tolerance Patient tolerated treatment well   Behavior During Therapy [facility] for tasks assessed/performed      Past Medical History  Diagnosis Date  . Hypertension   . Arthritis     Past Surgical History  Procedure Laterality Date  . Right knee arthroscopy  1987  . Left broken arm    . Neck surgery      cervical c4- c5  . Colonoscopy N/A 08/15/2013    Procedure: COLONOSCOPY;  Surgeon: Danie Binder, MD;  [facility];  Service: Endoscopy;  Laterality: N/A;  9:15 AM  . Knee arthroscopy with medial menisectomy Right 07/31/2014    Procedure: KNEE ARTHROSCOPY WITH MEDIAL AND LATERAL MENISECTOMY;  Surgeon: Carole Civil, MD;  Location: [facility];  Service: Orthopedics;  Laterality: Right;    There were no vitals taken for this visit.  Visit Diagnosis:  S/P right knee arthroscopy  Right knee pain  Weakness of right hip  Knee stiffness, right      Subjective Assessment - 08/19/14 1156    Symptoms Patient states no pain this morning   Pertinent History 07/31/14 Rt knee arthroscopy by doctor Aline Brochure. Scope performed for arthrotis and torn meniscus. Patient WBAT.    Currently in Pain? No/denies          [facility] PT Assessment - 08/19/14 0001    Assessment   Medical Diagnosis Rt knee stiffness s/p Rt knee arthroscopy.    Onset Date 07/31/14   Next  MD Visit [name], 08/24/14   Prior Therapy no   AROM   Right Hip External Rotation  45   Right Hip Internal Rotation  19   Left Hip External Rotation  50   Left Hip Internal Rotation  20   Right Knee Extension 1   Right Knee Flexion 121   Right Ankle Dorsiflexion 18   Left Ankle Dorsiflexion 19   Strength   Right Hip Flexion 4+/5   Right Hip Extension 3+/5   Right Hip ABduction 4+/5   Left Hip Flexion 5/5   Left Hip Extension 4-/5   Left Hip ABduction 4+/5   Right Knee Flexion 5/5   Right Knee Extension 4+/5   Left Knee Flexion 5/5   Left Knee Extension 5/5   Right Ankle Dorsiflexion 5/5   Left Ankle Dorsiflexion 5/5                  [facility] Adult PT Treatment/Exercise - 08/19/14 0001    Knee/Hip Exercises: Stretches   Active Hamstring Stretch 3 reps;30 seconds   Active Hamstring Stretch Limitations 3 way, 14 inch box   Knee/Hip Exercises: Standing   Other Standing Knee Exercises Facilitation of bilateral hip IR in standing; heel-toe stance and SLS on foam pad   Other Standing  Knee Exercises Functional lifting tasks with box and weights of 0#, 10#, and 20# in box                PT Education - 08/19/14 1159    Education provided Yes   Education Details Education provided regarding progress towards goals, PT POC moving forward   Person(s) Educated Patient   Methods Explanation   Comprehension Verbalized understanding          PT Short Term Goals - 08/19/14 1204    PT SHORT TERM GOAL #1   Baseline 08/19/14- 1-121 degrees   Time 4   Period Weeks   Status Achieved   PT SHORT TERM GOAL #2   Title Patient will be able to Flex Rt knee to 120 degrees to be able to squat to a chair without compensatign with weight shift to Lt LE.    Baseline 1-121 degrees on 08/19/14   Time 4   Period Weeks   Status Achieved   PT SHORT TERM GOAL #3   Baseline 08/19/14- 4+/5   Time 4   Period Weeks   Status Achieved   PT SHORT TERM GOAL #4   Title Patient will be  independent with HEP.    Time 4   Period Weeks   Status On-going   PT SHORT TERM GOAL #5   Title patient will dmeosntrate increased hip internal rotation bilaterally to > 30 degrees.    Baseline 08/19/14- approx 18 to 19 degrees bilaterally   Time 4   Period Weeks   Status On-going           PT Long Term Goals - 08/19/14 1207    PT LONG TERM GOAL #1   Title Patient will be able to Flex Rt knee to 125 degrees in order to be symmetrical with L knee and to be able to kneel to the floor   Baseline 08/19/14- 121 degrees flexion R, 125 degrees flexion L   Time 8   Period Weeks   Status On-going   PT LONG TERM GOAL #2   Title Patient will dmeosntrate Rt quadricep strength of 5/5 MMT to be able to lift 50lb from the floor for work   Baseline 08/19/14- 4+/5   Time 8   Period Weeks   Status On-going   PT LONG TERM GOAL #3   Title Patient will dmeonstrate increased hip abductionstrength of 4/5 MMT to increase hip/knee stability during gait.    Baseline 08/19/14- 4+/5 bilaterally   Time 8   Period Weeks   Status Achieved   PT LONG TERM GOAL #4   Title patient will be able to walk up and down stairs, ladders, crawl, and perform overhead lifting tasks with good biomechanics in order to safely return to work   Time 8   Period Weeks   Status On-going               Plan - 08/19/14 1201    Clinical Impression Statement Re-assessment performed on this date. Patient shows improvements in strength and good R knee ROM however continues to demonstrate stiffness bilateral hips. Patient has a very physical job and is required to crawl, climb ladders, lift heavy items overhead, and  creep under machinery. Patient has made good progress towards goals but will benefit from continued skilled PT services to continue to address range and strength deficits, assess abilty to safely perform functional work tasks, and continue to improve patient's functional task performance as a whole in order  to  prepare for safe return to work.    Pt will benefit from skilled therapeutic intervention in order to improve on the following deficits Abnormal gait;Decreased endurance;Improper body mechanics;Decreased scar mobility;Decreased strength;Impaired flexibility;Difficulty walking;Decreased mobility;Pain;Decreased range of motion;Increased fascial restricitons   Rehab Potential Good   PT Frequency 2x / week   PT Duration 8 weeks   PT Treatment/Interventions Stair training;Gait training;Functional mobility training;Patient/family education;Passive range of motion;Therapeutic activities;Therapeutic exercise;Manual techniques;Balance training   PT Next Visit Plan Focus on R quad end range strength; improve hip extensor strength; functional balance exercises; begin functional task training including crawling, creeping, ladder climbing, lifting overhead, squatting, etc.    Consulted and Agree with Plan of Care Patient        Problem List Patient Active Problem List   Diagnosis Date Noted  . Lateral meniscal tear   . Acute medial meniscal tear   . Arthritis of knee, right   . Torn medial meniscus 07/20/2014  . Anterior cruciate ligament tear 07/20/2014  . Primary osteoarthritis of right knee 07/20/2014    Deniece Ree PT, DPT [place] [address] [place], [place], [address] Phone: [phone]   Fax:  [phone]

## 2014-08-24 ENCOUNTER — Encounter: Payer: Self-pay | Admitting: Orthopedic Surgery

## 2014-08-24 ENCOUNTER — Ambulatory Visit (INDEPENDENT_AMBULATORY_CARE_PROVIDER_SITE_OTHER): Payer: 59 | Admitting: Orthopedic Surgery

## 2014-08-24 VITALS — BP 136/93 | Ht 70.0 in | Wt 244.0 lb

## 2014-08-24 DIAGNOSIS — Z9889 Other specified postprocedural states: Secondary | ICD-10-CM

## 2014-08-24 NOTE — Patient Instructions (Signed)
Increase ice to 4 times daily for 30 minutes

## 2014-08-24 NOTE — Progress Notes (Signed)
Chief Complaint  Patient presents with  . 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

## 2014-08-25 ENCOUNTER — Ambulatory Visit (HOSPITAL_COMMUNITY): Payer: 59 | Attending: Internal Medicine

## 2014-08-25 DIAGNOSIS — R29898 Other symptoms and signs involving the musculoskeletal system: Secondary | ICD-10-CM

## 2014-08-25 DIAGNOSIS — M6281 Muscle weakness (generalized): Secondary | ICD-10-CM | POA: Diagnosis not present

## 2014-08-25 DIAGNOSIS — R262 Difficulty in walking, not elsewhere classified: Secondary | ICD-10-CM | POA: Insufficient documentation

## 2014-08-25 DIAGNOSIS — M25561 Pain in right knee: Secondary | ICD-10-CM | POA: Diagnosis present

## 2014-08-25 DIAGNOSIS — M25661 Stiffness of right knee, not elsewhere classified: Secondary | ICD-10-CM | POA: Diagnosis not present

## 2014-08-25 DIAGNOSIS — Z9889 Other specified postprocedural states: Secondary | ICD-10-CM

## 2014-08-25 NOTE — Therapy (Signed)
[place], [place], [address] Phone: [phone]   Fax:  [phone]  Physical Therapy Treatment  Patient Details  Name: Eugene Hill MRN: [id number] Date of Birth: 11/23/1960 Referring Provider:  Delphina Cahill, MD  Encounter Date: 08/25/2014      PT End of Session - 08/25/14 1055    Visit Number 7   Number of Visits 18   Date for PT Re-Evaluation 09/09/14   Authorization Type U[employer]   Authorization - Visit Number 7   Authorization - Number of Visits 18   PT Start Time 1020   PT Stop Time 1100   PT Time Calculation (min) 40 min   Activity Tolerance Patient tolerated treatment well   Behavior During Therapy [facility] for tasks assessed/performed      Past Medical History  Diagnosis Date  . Hypertension   . Arthritis     Past Surgical History  Procedure Laterality Date  . Right knee arthroscopy  1987  . Left broken arm    . Neck surgery      cervical c4- c5  . Colonoscopy N/A 08/15/2013    Procedure: COLONOSCOPY;  Surgeon: Danie Binder, MD;  [facility];  Service: Endoscopy;  Laterality: N/A;  9:15 AM  . Knee arthroscopy with medial menisectomy Right 07/31/2014    Procedure: KNEE ARTHROSCOPY WITH MEDIAL AND LATERAL MENISECTOMY;  Surgeon: Carole Civil, MD;  Location: [facility];  Service: Orthopedics;  Laterality: Right;    There were no vitals taken for this visit.  Visit Diagnosis:  S/P right knee arthroscopy  Right knee pain  Weakness of right hip  Knee stiffness, right      Subjective Assessment - 08/25/14 1021    Symptoms Knee feeling good, hip feel tight today.     Currently in Pain? No/denies          [facility] PT Assessment - 08/25/14 0001    Assessment   Medical Diagnosis Rt knee stiffness s/p Rt knee arthroscopy.    Onset Date 07/31/14   Next MD Visit [name], 09/15/2014   Prior Therapy no                  [facility] Adult PT Treatment/Exercise - 08/25/14 0001    Exercises   Exercises Knee/Hip   Knee/Hip Exercises: Stretches   Active Hamstring Stretch 3 reps;30 seconds   Active Hamstring Stretch Limitations 3 way, 14 inch box   Quad Stretch 3 reps;30 seconds   Quad Stretch Limitations prone with rope   Piriformis Stretch 2 reps;30 seconds   Piriformis Stretch Limitations seated   Gastroc Stretch 3 reps;30 seconds   Gastroc Stretch Limitations slantboard   Knee/Hip Exercises: Standing   Heel Raises 20 reps   Heel Raises Limitations proper lifting blue ball from 14 in box then heel raises    Forward Lunges Both;10 reps;Limitations   Forward Lunges Limitations common lunge matrix    Side Lunges Both;10 reps   Side Lunges Limitations common lunge matrix    Terminal Knee Extension Limitations   Terminal Knee Extension Limitations HEP   Functional Squat 20 reps;Limitations   Functional Squat Limitations proper lifting blue ball from 14 in box then heel raises    Other Standing Knee Exercises sumowalking with blue tband 1RT   Other Standing Knee Exercises kneeling to airex 10x each   Knee/Hip Exercises: Prone   Other Prone Exercises quadruped forward/backward and R/L weight shifting 10x  Other Prone Exercises Crawling forward/back and R/L 5x                  PT Short Term Goals - 08/25/14 1103    PT SHORT TERM GOAL #1   Title Patient will be able to fully straighten Rt knee to be able to normalize stride length.    Status Achieved   PT SHORT TERM GOAL #2   Title Patient will be able to Flex Rt knee to 120 degrees to be able to squat to a chair without compensatign with weight shift to Lt LE.    Status Achieved   PT SHORT TERM GOAL #3   Title Patient will dmeosntrate Rt quadricep strength of 4/5 MMT to be bale to go up and down stairs with 1 HHA.    Status Achieved   PT SHORT TERM GOAL #4   Title Patient will be independent with HEP.    Status On-going   PT SHORT TERM GOAL #5   Title patient will dmeosntrate increased hip  internal rotation bilaterally to > 30 degrees.    Status On-going           PT Long Term Goals - 08/25/14 1104    PT LONG TERM GOAL #1   Title Patient will be able to Flex Rt knee to 125 degrees in order to be symmetrical with L knee and to be able to kneel to the floor   Status On-going   PT LONG TERM GOAL #2   Title Patient will dmeosntrate Rt quadricep strength of 5/5 MMT to be able to lift 50lb from the floor for work   Status On-going   PT LONG TERM GOAL #3   Title Patient will dmeonstrate increased hip abductionstrength of 4/5 MMT to increase hip/knee stability during gait.    Status Achieved   PT LONG TERM GOAL #4   Title patient will be able to walk up and down stairs, ladders, crawl, and perform overhead lifting tasks with good biomechanics in order to safely return to work   Status On-going   PT LONG TERM GOAL #5   Title add goal for positional changes, crawling, getting up    Status On-going               Plan - 08/25/14 1055    Clinical Impression Statement Progressed functional strengthening with RTW activities.  Progressed lunges to floor for increased weight bearing and began quaduped and kneeling activities on airex.  Pt encouraged to wear knee brace when kneeling on concreate floors for support.  Pt able to demosntrate appropraite mechanics and weight loading with squats this session, added sumowalking for gluteal max and med strengthening.  No reporrts of pain through session.   PT Next Visit Plan Focus on R quad end range strength; improve hip extensor strength; functional balance exercises; continue functional task training including crawling, creeping, ladder climbing, lifting overhead, squatting, etc.         Problem List Patient Active Problem List   Diagnosis Date Noted  . Lateral meniscal tear   . Acute medial meniscal tear   . Arthritis of knee, right   . Torn medial meniscus 07/20/2014  . Anterior cruciate ligament tear 07/20/2014  .  Primary osteoarthritis of right knee 07/20/2014   Ihor Austin, [place]  Aldona Lento 08/25/2014, 11:05 AM  [place], [place], [address] Phone: [phone]   Fax:  [phone]

## 2014-08-27 ENCOUNTER — Ambulatory Visit (HOSPITAL_COMMUNITY): Payer: 59 | Admitting: Physical Therapy

## 2014-08-27 DIAGNOSIS — Z9889 Other specified postprocedural states: Secondary | ICD-10-CM

## 2014-08-27 DIAGNOSIS — M25661 Stiffness of right knee, not elsewhere classified: Secondary | ICD-10-CM

## 2014-08-27 DIAGNOSIS — M25561 Pain in right knee: Secondary | ICD-10-CM | POA: Diagnosis not present

## 2014-08-27 DIAGNOSIS — R29898 Other symptoms and signs involving the musculoskeletal system: Secondary | ICD-10-CM

## 2014-08-27 NOTE — Therapy (Signed)
[place], [place], [address] Phone: [phone]   Fax:  [phone]  Physical Therapy Treatment  Patient Details  Name: Eugene Hill MRN: [id number] Date of Birth: October 23, 1960 Referring Provider:  Carole Civil, MD  Encounter Date: 08/27/2014      PT End of Session - 08/27/14 0846    Visit Number 8   Number of Visits 18   Date for PT Re-Evaluation 09/09/14   Authorization Type UHC   Authorization - Visit Number 8   Authorization - Number of Visits 18   PT Start Time 9798   PT Stop Time 0930   PT Time Calculation (min) 43 min      Past Medical History  Diagnosis Date  . Hypertension   . Arthritis     Past Surgical History  Procedure Laterality Date  . Right knee arthroscopy  1987  . Left broken arm    . Neck surgery      cervical c4- c5  . Colonoscopy N/A 08/15/2013    Procedure: COLONOSCOPY;  Surgeon: Danie Binder, MD;  [facility];  Service: Endoscopy;  Laterality: N/A;  9:15 AM  . Knee arthroscopy with medial menisectomy Right 07/31/2014    Procedure: KNEE ARTHROSCOPY WITH MEDIAL AND LATERAL MENISECTOMY;  Surgeon: Carole Civil, MD;  Location: [facility];  Service: Orthopedics;  Laterality: Right;    There were no vitals taken for this visit.  Visit Diagnosis:  S/P right knee arthroscopy  Right knee pain  Weakness of right hip  Knee stiffness, right      Subjective Assessment - 08/27/14 0848    Symptoms Patient states feeling good notes some soreness following lunges.    Currently in Pain? No/denies          [facility] PT Assessment - 08/27/14 0001    AROM   Right Knee Flexion 130          [facility] Adult PT Treatment/Exercise - 08/27/14 0001    Knee/Hip Exercises: Stretches   Active Hamstring Stretch Limitations 3 way to second step 10x 3 seconds   Quad Stretch 3 reps;30 seconds   Quad Stretch Limitations prone with rope   ITB Stretch Limitations 10x 3 seconds   Piriformis Stretch Limitations 10x 3 seconds 3 way seated   Gastroc Stretch Limitations 10x 3" 3 way at wall.    Knee/Hip Exercises: Standing   Forward Lunges Limitations 5x common lunge matrix on floor with knee high reach   Functional Squat Limitations 5lb dumbbells squat matrix 5x    Other Standing Knee Exercises 4 way pick up and reach 5x with 5lb dumbbell   Other Standing Knee Exercises 3D hip excursion 1/2 kneeling on airex 10x each             PT Short Term Goals - 08/25/14 1103    PT SHORT TERM GOAL #1   Title Patient will be able to fully straighten Rt knee to be able to normalize stride length.    Status Achieved   PT SHORT TERM GOAL #2   Title Patient will be able to Flex Rt knee to 120 degrees to be able to squat to a chair without compensatign with weight shift to Lt LE.    Status Achieved   PT SHORT TERM GOAL #3   Title Patient will dmeosntrate Rt quadricep strength of 4/5 MMT to be bale to go up and down stairs with 1 HHA.  Status Achieved   PT SHORT TERM GOAL #4   Title Patient will be independent with HEP.    Status On-going   PT SHORT TERM GOAL #5   Title patient will dmeosntrate increased hip internal rotation bilaterally to > 30 degrees.    Status On-going           PT Long Term Goals - 08/25/14 1104    PT LONG TERM GOAL #1   Title Patient will be able to Flex Rt knee to 125 degrees in order to be symmetrical with L knee and to be able to kneel to the floor   Status On-going   PT LONG TERM GOAL #2   Title Patient will dmeosntrate Rt quadricep strength of 5/5 MMT to be able to lift 50lb from the floor for work   Status On-going   PT LONG TERM GOAL #3   Title Patient will dmeonstrate increased hip abductionstrength of 4/5 MMT to increase hip/knee stability during gait.    Status Achieved   PT LONG TERM GOAL #4   Title patient will be able to walk up and down stairs, ladders, crawl, and perform overhead lifting tasks with good biomechanics in order  to safely return to work   Status On-going   PT LONG TERM GOAL #5   Title add goal for positional changes, crawling, getting up    Status On-going            Plan - 08/27/14 0927    Clinical Impression Statement Exercises progressed to increase kneeling toelrance and increase ability to lift in and squt in multiple direction for increased work tolerance. no pain noted though patient display limited Rt hip mobility with Rt foor forward and Lt UE pick up and reach secondary to glut and tensor fascia latae limited ROM.    PT Next Visit Plan continue functional task training including crawling, creeping, ladder climbing, lifting overhead, squatting, etc. Progress lunges to ankle high reach per tolerance.         Problem List Patient Active Problem List   Diagnosis Date Noted  . Lateral meniscal tear   . Acute medial meniscal tear   . Arthritis of knee, right   . Torn medial meniscus 07/20/2014  . Anterior cruciate ligament tear 07/20/2014  . Primary osteoarthritis of right knee 07/20/2014    Leia Alf 08/27/2014, 9:31 AM  [place] [address] [place], [place], [address] Phone: [phone]   Fax:  [phone]

## 2014-08-31 ENCOUNTER — Ambulatory Visit (HOSPITAL_COMMUNITY): Payer: 59 | Admitting: Physical Therapy

## 2014-09-01 ENCOUNTER — Encounter (HOSPITAL_COMMUNITY): Payer: 59 | Admitting: Physical Therapy

## 2014-09-03 ENCOUNTER — Ambulatory Visit (HOSPITAL_COMMUNITY): Payer: 59 | Admitting: Physical Therapy

## 2014-09-03 DIAGNOSIS — Z9889 Other specified postprocedural states: Secondary | ICD-10-CM

## 2014-09-03 DIAGNOSIS — M25561 Pain in right knee: Secondary | ICD-10-CM

## 2014-09-03 DIAGNOSIS — M25661 Stiffness of right knee, not elsewhere classified: Secondary | ICD-10-CM

## 2014-09-03 DIAGNOSIS — R29898 Other symptoms and signs involving the musculoskeletal system: Secondary | ICD-10-CM

## 2014-09-03 NOTE — Therapy (Signed)
[place], [place], [address] Phone: [phone]   Fax:  [phone]  Physical Therapy Treatment  Patient Details  Name: Eugene Hill MRN: [id number] Date of Birth: Sep 11, 1960 Referring Provider:  Carole Civil, MD  Encounter Date: 09/03/2014      PT End of Session - 09/03/14 1021    Visit Number 9   Number of Visits 18   Date for PT Re-Evaluation 09/09/14   Authorization Type UHC   Authorization - Visit Number 9   Authorization - Number of Visits 18   PT Start Time 0930   PT Stop Time 1013   PT Time Calculation (min) 43 min   Activity Tolerance Patient tolerated treatment well   Behavior During Therapy [facility] for tasks assessed/performed      Past Medical History  Diagnosis Date  . Hypertension   . Arthritis     Past Surgical History  Procedure Laterality Date  . Right knee arthroscopy  1987  . Left broken arm    . Neck surgery      cervical c4- c5  . Colonoscopy N/A 08/15/2013    Procedure: COLONOSCOPY;  Surgeon: Danie Binder, MD;  [facility];  Service: Endoscopy;  Laterality: N/A;  9:15 AM  . Knee arthroscopy with medial menisectomy Right 07/31/2014    Procedure: KNEE ARTHROSCOPY WITH MEDIAL AND LATERAL MENISECTOMY;  Surgeon: Carole Civil, MD;  Location: [facility];  Service: Orthopedics;  Laterality: Right;    There were no vitals taken for this visit.  Visit Diagnosis:  S/P right knee arthroscopy  Right knee pain  Weakness of right hip  Knee stiffness, right      Subjective Assessment - 09/03/14 0931    Symptoms Patient states his knee is not bothering him but his back is somewhat painful; he was helping his father load wood into a truck and reached for a piece of wood, felt twinge in his back right below shoulder blade.    Pertinent History 07/31/14 Rt knee arthroscopy by doctor Aline Brochure. Scope performed for arthrotis and torn meniscus. Patient WBAT.    Currently in  Pain? Yes   Pain Score 4    Pain Location Back  no pain in knee   Pain Orientation Mid                    [facility] Adult PT Treatment/Exercise - 09/03/14 0001    Knee/Hip Exercises: Stretches   Active Hamstring Stretch 3 reps;30 seconds   Active Hamstring Stretch Limitations 3 way to 14 inch box   Quad Stretch 2 reps;30 seconds   Quad Stretch Limitations prone with rope   Piriformis Stretch 2 reps;30 seconds   Piriformis Stretch Limitations seated   Gastroc Stretch 3 reps;30 seconds   Gastroc Stretch Limitations slant board, 3 way    Knee/Hip Exercises: Standing   Heel Raises 1 set;10 reps   Heel Raises Limitations Lifting blue ball onto top of shelf   Forward Lunges Both;1 set;10 reps   Forward Lunges Limitations Star lunges with reach past knee 1x5 each side   Forward Step Up Both;1 set;10 reps   Forward Step Up Limitations 8 inch step   Functional Squat 1 set;10 reps   Functional Squat Limitations overhead press with squats   Other Standing Knee Exercises Kneel to stand 1x5 each side without box, 1x5 each side with box   Other Standing Knee Exercises 3D hip excursion 1/2 kneeling  on airex 10x each; crawling along mat forwards and back 1x5                PT Education - 09/03/14 1020    Education provided Yes   Education Details Patient educated regarding practicing getting up from kneeling on floor as long as there is no pain   Person(s) Educated Patient   Methods Explanation   Comprehension Verbalized understanding;Returned demonstration          PT Short Term Goals - 08/25/14 1103    PT SHORT TERM GOAL #1   Title Patient will be able to fully straighten Rt knee to be able to normalize stride length.    Status Achieved   PT SHORT TERM GOAL #2   Title Patient will be able to Flex Rt knee to 120 degrees to be able to squat to a chair without compensatign with weight shift to Lt LE.    Status Achieved   PT SHORT TERM GOAL #3   Title Patient will  dmeosntrate Rt quadricep strength of 4/5 MMT to be bale to go up and down stairs with 1 HHA.    Status Achieved   PT SHORT TERM GOAL #4   Title Patient will be independent with HEP.    Status On-going   PT SHORT TERM GOAL #5   Title patient will dmeosntrate increased hip internal rotation bilaterally to > 30 degrees.    Status On-going           PT Long Term Goals - 08/25/14 1104    PT LONG TERM GOAL #1   Title Patient will be able to Flex Rt knee to 125 degrees in order to be symmetrical with L knee and to be able to kneel to the floor   Status On-going   PT LONG TERM GOAL #2   Title Patient will dmeosntrate Rt quadricep strength of 5/5 MMT to be able to lift 50lb from the floor for work   Status On-going   PT LONG TERM GOAL #3   Title Patient will dmeonstrate increased hip abductionstrength of 4/5 MMT to increase hip/knee stability during gait.    Status Achieved   PT LONG TERM GOAL #4   Title patient will be able to walk up and down stairs, ladders, crawl, and perform overhead lifting tasks with good biomechanics in order to safely return to work   Status On-going   PT LONG TERM GOAL #5   Title add goal for positional changes, crawling, getting up    Status On-going               Plan - 09/03/14 1021    Clinical Impression Statement Continued progression of functional exercises for kneeling tolerance, lifting from kneeling, lunges in multiple directions for improved work tolerance. Also performed crawling and overheading lifting to address work toelrance and skills. Continues to display some instability in knee during deep lunges.    Pt will benefit from skilled therapeutic intervention in order to improve on the following deficits Abnormal gait;Decreased endurance;Improper body mechanics;Decreased scar mobility;Decreased strength;Impaired flexibility;Difficulty walking;Decreased mobility;Pain;Decreased range of motion;Increased fascial restricitons   Rehab Potential  Good   PT Frequency 2x / week   PT Duration 8 weeks   PT Treatment/Interventions Stair training;Gait training;Functional mobility training;Patient/family education;Passive range of motion;Therapeutic activities;Therapeutic exercise;Manual techniques;Balance training   PT Next Visit Plan continue functional task training including crawling, creeping, ladder climbing, lifting overhead, squatting, etc. Progress lunges to ankle high reach per tolerance.  Consulted and Agree with Plan of Care Patient        Problem List Patient Active Problem List   Diagnosis Date Noted  . Lateral meniscal tear   . Acute medial meniscal tear   . Arthritis of knee, right   . Torn medial meniscus 07/20/2014  . Anterior cruciate ligament tear 07/20/2014  . Primary osteoarthritis of right knee 07/20/2014    Deniece Ree PT, DPT [facility] [address] [place], [place], [address] Phone: [phone]   Fax:  [phone]

## 2014-09-08 ENCOUNTER — Ambulatory Visit (HOSPITAL_COMMUNITY): Payer: 59

## 2014-09-08 DIAGNOSIS — Z9889 Other specified postprocedural states: Secondary | ICD-10-CM

## 2014-09-08 DIAGNOSIS — M25561 Pain in right knee: Secondary | ICD-10-CM

## 2014-09-08 DIAGNOSIS — M25661 Stiffness of right knee, not elsewhere classified: Secondary | ICD-10-CM

## 2014-09-08 DIAGNOSIS — R29898 Other symptoms and signs involving the musculoskeletal system: Secondary | ICD-10-CM

## 2014-09-08 NOTE — Therapy (Signed)
[facility] [address] [place], [place], [address] Phone: [phone]   Fax:  [phone]  Physical Therapy Treatment  Patient Details  Name: Eugene Hill MRN: [id number] Date of Birth: [birth year] Referring Provider:  Vickki Hearing, MD  Encounter Date: 09/08/2014      PT End of Session - 09/08/14 0953    Visit Number 10   Number of Visits 18   Date for PT Re-Evaluation 10/03/14   Authorization Type UHC   Authorization - Visit Number 10   Authorization - Number of Visits 18   PT Start Time 0848   PT Stop Time 0935   PT Time Calculation (min) 47 min   Activity Tolerance Patient tolerated treatment well   Behavior During Therapy [facility] for tasks assessed/performed      Past Medical History  Diagnosis Date  . Hypertension   . Arthritis     Past Surgical History  Procedure Laterality Date  . Right knee arthroscopy  1987  . Left broken arm    . Neck surgery      cervical c4- c5  . Colonoscopy N/A 08/15/2013    Procedure: COLONOSCOPY;  Surgeon: West Bali, MD;  Location: [facility];  Service: Endoscopy;  Laterality: N/A;  9:15 AM  . Knee arthroscopy with medial menisectomy Right 07/31/2014    Procedure: KNEE ARTHROSCOPY WITH MEDIAL AND LATERAL MENISECTOMY;  Surgeon: Vickki Hearing, MD;  Location: [facility];  Service: Orthopedics;  Laterality: Right;    There were no vitals filed for this visit.  Visit Diagnosis:  S/P right knee arthroscopy  Right knee pain  Weakness of right hip  Knee stiffness, right      Subjective Assessment - 09/08/14 0903    Symptoms Pt stated knee is feeling good today, no reports on knee   How long can you stand comfortably? Able to stand comfortably for 5-6 hours   How long can you walk comfortably? Able to walk comfortably for one hour with no AD (Able ot walk with one crutch without pain. )   Currently in Pain? No/denies            [facility] PT Assessment - 09/08/14 0001    Assessment   Medical Diagnosis Rt knee stiffness s/p Rt knee arthroscopy.    Onset Date 07/31/14   Next MD Visit Mort Sawyers, 09/15/2014   Prior Therapy no   AROM   Right Hip External Rotation  48  was 45   Right Hip Internal Rotation  22  was 19   Left Hip External Rotation  58  was 50   Left Hip Internal Rotation  20  was 20   Right Knee Extension 0   Right Knee Flexion 127   Left Knee Flexion 131   Right Ankle Dorsiflexion 26  was 18   Left Ankle Dorsiflexion 20  was 19   Strength   Right Hip Flexion 5/5  was 4+/5   Right Hip Extension 4/5  was 3+/5   Right Hip ABduction 4+/5  was 4+/5   Left Hip Flexion 5/5   Left Hip Extension 4+/5  was 4-/5   Left Hip ABduction 4+/5  was 4+/5   Right Knee Extension 4+/5  was 4+/5   Flexibility   Piriformis limited IR                   [facility] Adult PT Treatment/Exercise - 09/08/14 0941    Exercises  Exercises Knee/Hip   Knee/Hip Exercises: Stretches   Active Hamstring Stretch 3 reps;30 seconds   Active Hamstring Stretch Limitations 3 way to 14 inch box   Piriformis Stretch 2 reps;30 seconds   Piriformis Stretch Limitations seated   Gastroc Stretch 3 reps;30 seconds   Gastroc Stretch Limitations slant board, 3 way    Knee/Hip Exercises: Standing   Forward Lunges Both;1 set;10 reps   Forward Lunges Limitations common matrix to floor   Functional Squat 2 sets;10 reps   Functional Squat Limitations Proper lifting from floor 39# proper lifting; 2 squats then walk around 4in step 5 reps each side   Other Standing Knee Exercises Kneel to stand 10x    Other Standing Knee Exercises 3D hip excursion 1/2 kneeling on airex 10x each; crawling along mat forwards and back 1x5; set up and walk up ladder 3x; 10 STS from 12 in step                  PT Short Term Goals - 09/08/14 0905    PT SHORT TERM GOAL #1   Title Patient will be able to fully straighten Rt knee to be able to normalize stride length.    Status  Achieved   PT SHORT TERM GOAL #2   Title Patient will be able to Flex Rt knee to 120 degrees to be able to squat to a chair without compensatign with weight shift to Lt LE.    Status Achieved   PT SHORT TERM GOAL #3   Title Patient will dmeosntrate Rt quadricep strength of 4/5 MMT to be bale to go up and down stairs with 1 HHA.    Status Achieved   PT SHORT TERM GOAL #4   Title Patient will be independent with HEP.    Status Achieved   PT SHORT TERM GOAL #5   Title patient will dmeosntrate increased hip internal rotation bilaterally to > 30 degrees.    Baseline 09/08/2014 Rt IR 22 degrees and Lt 20 degrees   Status On-going           PT Long Term Goals - 09/08/14 0915    PT LONG TERM GOAL #1   Title Patient will be able to Flex Rt knee to 125 degrees in order to be symmetrical with L knee and to be able to kneel to the floor   Baseline 09/08/2014 Rt 127, Lt 131 degrees   Status Partially Met   PT LONG TERM GOAL #2   Title Patient will dmeosntrate Rt quadricep strength of 5/5 MMT to be able to lift 50lb from the floor for work   Baseline 09/08/2014 Rt quad 4+/5 able to demosntrate appropriate lifting with 39# from floot good mechanics   Status Partially Met   PT LONG TERM GOAL #3   Title Patient will dmeonstrate increased hip abductionstrength of 4/5 MMT to increase hip/knee stability during gait.    Baseline 09/08/2014 4+/5 Bilaterally   Status Achieved   PT LONG TERM GOAL #4   Title patient will be able to walk up and down stairs, ladders, crawl, and perform overhead lifting tasks with good biomechanics in order to safely return to work   Status On-going   PT LONG TERM GOAL #5   Title add goal for positional changes, crawling, getting up    Status On-going               Plan - 09/08/14 0957    Clinical Impression Statement Reassessment complete with the  following findings:  Pt independent with HEP daily and able to demonstrate appropraite techniques with all  exercises.  Strength is progressing well and abilty to flex Rt knee 127 degrees.  Pt continues to have decreased bilateral hip mobility with abiltiy to internally rotate hips approrximately 20 degrees.  Pt imrpovoing confidence with RTW activtiies but does continue to show instability with kneeling activities  Pt will continues to benefit from skilled intervention to improve hip mobilty and functtional strengthening fior RTW safely.     PT Next Visit Plan continue functional task training including crawling, creeping, ladder climbing, lifting overhead, squatting, etc  Continue with squat walk around, piriformis stretches, common and uncommon lunge matrix.        Problem List Patient Active Problem List   Diagnosis Date Noted  . Lateral meniscal tear   . Acute medial meniscal tear   . Arthritis of knee, right   . Torn medial meniscus 07/20/2014  . Anterior cruciate ligament tear 07/20/2014  . Primary osteoarthritis of right knee 07/20/2014   Ihor Austin, [place]   [place] [address] [place], [place], [address] Phone: [phone]   Fax:  [phone]   Devona Konig PT DPT   09/08/14 [phone]

## 2014-09-10 ENCOUNTER — Ambulatory Visit (HOSPITAL_COMMUNITY): Payer: 59

## 2014-09-10 DIAGNOSIS — R29898 Other symptoms and signs involving the musculoskeletal system: Secondary | ICD-10-CM

## 2014-09-10 DIAGNOSIS — M25561 Pain in right knee: Secondary | ICD-10-CM | POA: Diagnosis not present

## 2014-09-10 DIAGNOSIS — M25661 Stiffness of right knee, not elsewhere classified: Secondary | ICD-10-CM

## 2014-09-10 DIAGNOSIS — Z9889 Other specified postprocedural states: Secondary | ICD-10-CM

## 2014-09-10 NOTE — Therapy (Signed)
[place], [place], [address] Phone: [phone]   Fax:  [phone]  Physical Therapy Treatment  Patient Details  Name: Eugene Hill MRN: [id number] Date of Birth: 11/07/1960 Referring Provider:  Carole Civil, MD  Encounter Date: 09/10/2014      PT End of Session - 09/10/14 1021    Visit Number 11   Number of Visits 18   Date for PT Re-Evaluation 10/03/14   Authorization Type UHC   Authorization - Visit Number 11   Authorization - Number of Visits 18   PT Start Time 0932   PT Stop Time 1018   PT Time Calculation (min) 46 min   Activity Tolerance Patient tolerated treatment well   Behavior During Therapy [facility] for tasks assessed/performed      Past Medical History  Diagnosis Date  . Hypertension   . Arthritis     Past Surgical History  Procedure Laterality Date  . Right knee arthroscopy  1987  . Left broken arm    . Neck surgery      cervical c4- c5  . Colonoscopy N/A 08/15/2013    Procedure: COLONOSCOPY;  Surgeon: Danie Binder, MD;  [facility];  Service: Endoscopy;  Laterality: N/A;  9:15 AM  . Knee arthroscopy with medial menisectomy Right 07/31/2014    Procedure: KNEE ARTHROSCOPY WITH MEDIAL AND LATERAL MENISECTOMY;  Surgeon: Carole Civil, MD;  Location: [facility];  Service: Orthopedics;  Laterality: Right;    There were no vitals filed for this visit.  Visit Diagnosis:  S/P right knee arthroscopy  Right knee pain  Weakness of right hip  Knee stiffness, right      Subjective Assessment - 09/10/14 0935    Symptoms Pt stated knee feeling good today, pain minimal medial Rt knee  RTW 09/24/2014 full duty   Currently in Pain? Yes   Pain Score 1    Pain Location Knee   Pain Orientation Medial;Right   Pain Descriptors / Indicators Aching               [facility] Adult PT Treatment/Exercise - 09/10/14 0001    Exercises   Exercises Knee/Hip   Knee/Hip Exercises:  Stretches   Active Hamstring Stretch 3 reps;30 seconds   Active Hamstring Stretch Limitations 3 way to 14 inch box   Quad Stretch 3 reps;30 seconds   Quad Stretch Limitations prone with rope   Piriformis Stretch 2 reps;30 seconds   Piriformis Stretch Limitations seated   Gastroc Stretch 3 reps;30 seconds   Gastroc Stretch Limitations slant board, 3 way    Knee/Hip Exercises: Standing   Forward Lunges Both;1 set;10 reps;5 reps   Forward Lunges Limitations 10reps common matrix to floor; 5 reps uncommon matrix   Functional Squat 10 reps;5 sets   Functional Squat Limitations 3D hip excursion; 2 squats then walk around step Bil LE; Golfers squat and reverse golfers squat to simulate lifting weights overhead with work   Other Standing Knee Exercises Kneel to stand 10x    Other Standing Knee Exercises Crawling forward and backward, side to side on mat 3RT each                PT Education - 09/10/14 0942    Education provided Yes          PT Short Term Goals - 09/10/14 0938    PT SHORT TERM GOAL #1   Title Patient will be able to fully  straighten Rt knee to be able to normalize stride length.    Status Achieved   PT SHORT TERM GOAL #2   Title Patient will be able to Flex Rt knee to 120 degrees to be able to squat to a chair without compensatign with weight shift to Lt LE.    Status Achieved   PT SHORT TERM GOAL #3   Title Patient will dmeosntrate Rt quadricep strength of 4/5 MMT to be bale to go up and down stairs with 1 HHA.    Status Achieved   PT SHORT TERM GOAL #4   Status Achieved   PT SHORT TERM GOAL #5   Title patient will dmeosntrate increased hip internal rotation bilaterally to > 30 degrees.            PT Long Term Goals - 09/10/14 1020    PT LONG TERM GOAL #1   Title Patient will be able to Flex Rt knee to 125 degrees in order to be symmetrical with L knee and to be able to kneel to the floor   PT LONG TERM GOAL #2   Title Patient will dmeosntrate Rt  quadricep strength of 5/5 MMT to be able to lift 50lb from the floor for work   PT [place] #3   Title Patient will dmeonstrate increased hip abductionstrength of 4/5 MMT to increase hip/knee stability during gait.    PT LONG TERM GOAL #4   Title patient will be able to walk up and down stairs, ladders, crawl, and perform overhead lifting tasks with good biomechanics in order to safely return to work   PT [place] #5   Title add goal for positional changes, crawling, getting up                Plan - 09/10/14 0942    Clinical Impression Statement Session focus on RTW activities following return on 09/24/2014.  Pt able to demonstrate improved depth with squat with equal weight bearing.  Progressed to uncommon lunge matrix for glueal and hamstirng strengthening and to imrpve hip mobility as well as increase with kneeling.  Pt appears to have increase ease with piriformis stretches this session.  Added core strengthening exercises to POC to increase ease with overhead lifting with RTW.  Pt reported knot over medial surgical incision, pt educated on scar tissue technqiues to reduce the knot.     PT Next Visit Plan continue functional task training including crawling, creeping, ladder climbing, lifting overhead, squatting, etc  Continue with squat walk around, piriformis stretches, common and uncommon lunge matrix.        Problem List Patient Active Problem List   Diagnosis Date Noted  . Lateral meniscal tear   . Acute medial meniscal tear   . Arthritis of knee, right   . Torn medial meniscus 07/20/2014  . Anterior cruciate ligament tear 07/20/2014  . Primary osteoarthritis of right knee 07/20/2014   Ihor Austin, [place]  Aldona Lento 09/10/2014, 10:22 AM  [place], [place], [address] Phone: [phone]   Fax:  [phone]

## 2014-09-15 ENCOUNTER — Ambulatory Visit (HOSPITAL_COMMUNITY): Payer: 59

## 2014-09-15 ENCOUNTER — Encounter: Payer: Self-pay | Admitting: Orthopedic Surgery

## 2014-09-15 ENCOUNTER — Ambulatory Visit (INDEPENDENT_AMBULATORY_CARE_PROVIDER_SITE_OTHER): Payer: Self-pay | Admitting: Orthopedic Surgery

## 2014-09-15 VITALS — BP 140/101 | Ht 70.0 in | Wt 244.0 lb

## 2014-09-15 DIAGNOSIS — M1711 Unilateral primary osteoarthritis, right knee: Secondary | ICD-10-CM

## 2014-09-15 DIAGNOSIS — R29898 Other symptoms and signs involving the musculoskeletal system: Secondary | ICD-10-CM

## 2014-09-15 DIAGNOSIS — M25561 Pain in right knee: Secondary | ICD-10-CM

## 2014-09-15 DIAGNOSIS — M25661 Stiffness of right knee, not elsewhere classified: Secondary | ICD-10-CM

## 2014-09-15 DIAGNOSIS — Z9889 Other specified postprocedural states: Secondary | ICD-10-CM

## 2014-09-15 DIAGNOSIS — S83511S Sprain of anterior cruciate ligament of right knee, sequela: Secondary | ICD-10-CM

## 2014-09-15 DIAGNOSIS — S83241A Other tear of medial meniscus, current injury, right knee, initial encounter: Secondary | ICD-10-CM

## 2014-09-15 NOTE — Progress Notes (Signed)
POST OP NOTE  Chief Complaint  Patient presents with  . Follow-up    post op 3, SARK, DOS 07/31/14    6-1/2 weeks postop. The patient and the patient says his knee feels good except when he was walking a lot at a car show this weekend. 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

## 2014-09-15 NOTE — Therapy (Signed)
[place], [place], [address] Phone: [phone]   Fax:  [phone]  Physical Therapy Treatment  Patient Details  Name: Eugene Hill MRN: [id number] Date of Birth: [birth year] Referring Provider:  Carole Civil, MD  Encounter Date: 09/15/2014      PT End of Session - 09/15/14 0953    Visit Number 12   Number of Visits 18   Date for PT Re-Evaluation 10/03/14   Authorization Type UHC   Authorization - Visit Number 12   Authorization - Number of Visits 18   PT Start Time 0930   PT Stop Time 1016   PT Time Calculation (min) 46 min   Activity Tolerance Patient tolerated treatment well   Behavior During Therapy [facility] for tasks assessed/performed      Past Medical History  Diagnosis Date  . Hypertension   . Arthritis     Past Surgical History  Procedure Laterality Date  . Right knee arthroscopy  1987  . Left broken arm    . Neck surgery      cervical c4- c5  . Colonoscopy N/A 08/15/2013    Procedure: COLONOSCOPY;  Surgeon: Danie Binder, MD;  [facility];  Service: Endoscopy;  Laterality: N/A;  9:15 AM  . Knee arthroscopy with medial menisectomy Right 07/31/2014    Procedure: KNEE ARTHROSCOPY WITH MEDIAL AND LATERAL MENISECTOMY;  Surgeon: Carole Civil, MD;  Location: [facility];  Service: Orthopedics;  Laterality: Right;    There were no vitals filed for this visit.  Visit Diagnosis:  S/P right knee arthroscopy  Right knee pain  Weakness of right hip  Knee stiffness, right      Subjective Assessment - 09/15/14 0935    Symptoms Pt stated he over did it this weekend, went to [facility] this weekend for a car show and did a lot of walking following session, was really sore last couple days.  Did take 1 rest break while driving to the beach, got out and walked around.  Pain scale 1-2/10 today   Currently in Pain? Yes   Pain Score 2    Pain Location Knee   Pain Orientation  Right;Medial   Pain Descriptors / Indicators Sore            [facility] PT Assessment - 09/15/14 0001    Assessment   Medical Diagnosis Rt knee stiffness s/p Rt knee arthroscopy.    Onset Date 07/31/14   Next MD Visit [name], 09/15/2014   Prior Therapy no   AROM   Right Hip Internal Rotation  25   Left Hip Internal Rotation  25   Right Knee Extension 0   Right Knee Flexion 129   Left Knee Flexion 131                   [facility] Adult PT Treatment/Exercise - 09/15/14 0951    Exercises   Exercises Knee/Hip   Knee/Hip Exercises: Stretches   Active Hamstring Stretch 3 reps;30 seconds   Active Hamstring Stretch Limitations 3 way to 14 inch box   Quad Stretch 3 reps;30 seconds   Quad Stretch Limitations prone with rope   Piriformis Stretch 3 reps;30 seconds   Piriformis Stretch Limitations seated   Gastroc Stretch 3 reps;30 seconds   Gastroc Stretch Limitations slant board, 3 way    Knee/Hip Exercises: Standing   Forward Lunges Both;5 sets   Forward Lunges Limitations 10reps common matrix to floor;  5 reps uncommon matrix   Functional Squat 10 reps;5 sets   Functional Squat Limitations 3D hip excursion; 2 squats then walk around step Bil LE; Golfers squat and reverse golfers squat to simulate lifting weights overhead with work   Other Standing Knee Exercises Kneel to stand 10x    Other Standing Knee Exercises Crawling forward and backward, side to side on mat 5RT each                  PT Short Term Goals - 09/15/14 0954    PT SHORT TERM GOAL #1   Title Patient will be able to fully straighten Rt knee to be able to normalize stride length.    Baseline 09/15/2014- 0-129   Status Achieved   PT SHORT TERM GOAL #2   Title Patient will be able to Flex Rt knee to 120 degrees to be able to squat to a chair without compensatign with weight shift to Lt LE.    Baseline 09/11/2014 0-129 degrees for Rt knee   Status Achieved   PT SHORT TERM GOAL #3   Title Patient  will dmeosntrate Rt quadricep strength of 4/5 MMT to be bale to go up and down stairs with 1 HHA.    Status Achieved   PT SHORT TERM GOAL #4   Title Patient will be independent with HEP.    Status Achieved   PT SHORT TERM GOAL #5   Title patient will dmeosntrate increased hip internal rotation bilaterally to > 30 degrees.    Baseline 09/15/2014 Bil 25 degrees   Status On-going           PT Long Term Goals - 09/15/14 0955    PT LONG TERM GOAL #1   Title Patient will be able to Flex Rt knee to 125 degrees in order to be symmetrical with L knee and to be able to kneel to the floor   Baseline 09/15/2014: Lt 131 degrees, Rt 129 degrees   Status Achieved   PT LONG TERM GOAL #2   Title Patient will dmeosntrate Rt quadricep strength of 5/5 MMT to be able to lift 50lb from the floor for work   Status Partially Met   PT [place] #3   Title Patient will dmeonstrate increased hip abductionstrength of 4/5 MMT to increase hip/knee stability during gait.    Status Achieved   PT LONG TERM GOAL #4   Title patient will be able to walk up and down stairs, ladders, crawl, and perform overhead lifting tasks with good biomechanics in order to safely return to work   Status On-going   PT LONG TERM GOAL #5   Title add goal for positional changes, crawling, getting up    Status On-going               Plan - 09/15/14 1009    Clinical Impression Statement Continued session focus on RTW activities and stretches /exercses to improve hip mobility especially IR for Bil hips.  ROM measurements taken with ability to flex knee symmetrical with Rt knee 0-129 and Lt knee at 0-131, Bil hip IR at 25 degrees.  Pt demonstrated good depth with appropraite weight bearing with squats.  Noted some instability with common and uncommon lunge matrix.  Pt stated pain scale reduced to 1/10 at end of session.   PT Next Visit Plan F/U with MD.  Recommend continuing functional task training including crawling,  creeping, ladder climbing, lifting overhead, squatting, etc  Continue with squat  walk around, piriformis stretches, common and uncommon lunge matrix.        Problem List Patient Active Problem List   Diagnosis Date Noted  . Lateral meniscal tear   . Acute medial meniscal tear   . Arthritis of knee, right   . Torn medial meniscus 07/20/2014  . Anterior cruciate ligament tear 07/20/2014  . Primary osteoarthritis of right knee 07/20/2014   Ihor Austin, [place]  Aldona Lento 09/15/2014, 10:37 AM  [place], [place], [address] Phone: [phone]   Fax:  [phone]

## 2014-09-15 NOTE — Patient Instructions (Signed)
RETURN TO WORK MARCH 31

## 2014-09-17 ENCOUNTER — Ambulatory Visit (HOSPITAL_COMMUNITY): Payer: 59 | Admitting: Physical Therapy

## 2014-09-17 DIAGNOSIS — M25561 Pain in right knee: Secondary | ICD-10-CM | POA: Diagnosis not present

## 2014-09-17 DIAGNOSIS — Z9889 Other specified postprocedural states: Secondary | ICD-10-CM

## 2014-09-17 DIAGNOSIS — R29898 Other symptoms and signs involving the musculoskeletal system: Secondary | ICD-10-CM

## 2014-09-17 DIAGNOSIS — M25661 Stiffness of right knee, not elsewhere classified: Secondary | ICD-10-CM

## 2014-09-17 NOTE — Therapy (Signed)
[place], [place], [address] Phone: [phone]   Fax:  [phone]  Physical Therapy Treatment  Patient Details  Name: Eugene Hill MRN: [id number] Date of Birth: 12-28-1960 Referring Provider:  Carole Civil, MD  Encounter Date: 09/17/2014      PT End of Session - 09/17/14 0858    Visit Number 13   Number of Visits 18   Date for PT Re-Evaluation 10/03/14   Authorization Type UHC   Authorization - Visit Number 13   Authorization - Number of Visits 18   PT Start Time 0900   PT Stop Time 0945   PT Time Calculation (min) 45 min   Activity Tolerance Patient tolerated treatment well   Behavior During Therapy [facility] for tasks assessed/performed      Past Medical History  Diagnosis Date  . Hypertension   . Arthritis     Past Surgical History  Procedure Laterality Date  . Right knee arthroscopy  1987  . Left broken arm    . Neck surgery      cervical c4- c5  . Colonoscopy N/A 08/15/2013    Procedure: COLONOSCOPY;  Surgeon: Danie Binder, MD;  Location[facility];  Service: Endoscopy;  Laterality: N/A;  9:15 AM  . Knee arthroscopy with medial menisectomy Right 07/31/2014    Procedure: KNEE ARTHROSCOPY WITH MEDIAL AND LATERAL MENISECTOMY;  Surgeon: Carole Civil, MD;  Location: [facility];  Service: Orthopedics;  Laterality: Right;    There were no vitals filed for this visit.  Visit Diagnosis:  S/P right knee arthroscopy  Right knee pain  Weakness of right hip  Knee stiffness, right                     [facility] Adult PT Treatment/Exercise - 09/17/14 0001    Knee/Hip Exercises: Stretches   Active Hamstring Stretch Limitations 3 way to 14 inch box 10x 3 seconds   Quad Stretch 3 reps;30 seconds   Quad Stretch Limitations prone with rope   Piriformis Stretch Limitations 10x 3 seconds 3 w3ay seated   Gastroc Stretch 3 reps;30 seconds   Gastroc Stretch Limitations slant board, 3  way    Knee/Hip Exercises: Plyometrics   Other Plyometric Exercises agility ladder   Knee/Hip Exercises: Standing   Forward Lunges Limitations Lunge matrix common and uncommon 5x    Functional Squat Limitations 10x squatting through full depth.    Other Standing Knee Exercises Kneel to stand 10x    Other Standing Knee Exercises lifitng 40lb from 1/2 kneeling 5x and lifting 50lb from 12" off floor with squatting 5x                   PT Short Term Goals - 09/15/14 0954    PT SHORT TERM GOAL #1   Title Patient will be able to fully straighten Rt knee to be able to normalize stride length.    Baseline 09/15/2014- 0-129   Status Achieved   PT SHORT TERM GOAL #2   Title Patient will be able to Flex Rt knee to 120 degrees to be able to squat to a chair without compensatign with weight shift to Lt LE.    Baseline 09/11/2014 0-129 degrees for Rt knee   Status Achieved   PT SHORT TERM GOAL #3   Title Patient will dmeosntrate Rt quadricep strength of 4/5 MMT to be bale to go up and down stairs with 1 HHA.  Status Achieved   PT SHORT TERM GOAL #4   Title Patient will be independent with HEP.    Status Achieved   PT SHORT TERM GOAL #5   Title patient will dmeosntrate increased hip internal rotation bilaterally to > 30 degrees.    Baseline 09/15/2014 Bil 25 degrees   Status On-going           PT Long Term Goals - 09/17/14 0946    PT LONG TERM GOAL #1   Title Patient will be able to Flex Rt knee to 125 degrees in order to be symmetrical with L knee and to be able to kneel to the floor   Status Achieved   PT LONG TERM GOAL #2   Title Patient will demonstrate Rt quadricep strength of 5/5 MMT to be able to lift 50lb from the floor for work   Status Partially Met   PT LONG TERM GOAL #3   Title Patient will dmeonstrate increased hip abductionstrength of 4/5 MMT to increase hip/knee stability during gait.    Status Achieved   PT LONG TERM GOAL #4   Title patient will be able to  walk up and down stairs, ladders, crawl, and perform overhead lifting tasks with good biomechanics in order to safely return to work   Status On-going   PT [place] #5   Title patient will be bale to craw, kneel , and stand up from floor without pain.    Status On-going               Plan - 09/17/14 0934    Clinical Impression Statement Continued session focus on RTW activities and stretches /exercses to improve hip mobility especially IR for Bil hips. depth of squatting to just below 100 degrees knee flexion indicating minor limitation in squatting to floor though patient is able to accomodate by kneeling to floor for lifting heavy objects. Good performance this session with all lunges. Agility ladder performd this esession for agilty wth good performance. Good performance with all kneealing and squatted liftign with 50lb.    PT Next Visit Plan Continuing functional task training including crawling, creeping, ladder climbing, lifting overhead, squatting, etc  Continue with squat walk around, piriformis stretches, common and uncommon lunge matrix. Nest appointment is patient's last before work so patient will dmeonstrate 100% readiness for work.         Problem List Patient Active Problem List   Diagnosis Date Noted  . Lateral meniscal tear   . Acute medial meniscal tear   . Arthritis of knee, right   . Torn medial meniscus 07/20/2014  . Anterior cruciate ligament tear 07/20/2014  . Primary osteoarthritis of right knee 07/20/2014   Devona Konig PT DPT [place], [place], [address] Phone: [phone]   Fax:  [phone]

## 2014-09-22 ENCOUNTER — Ambulatory Visit (HOSPITAL_COMMUNITY): Payer: 59

## 2014-09-22 DIAGNOSIS — M25561 Pain in right knee: Secondary | ICD-10-CM | POA: Diagnosis not present

## 2014-09-22 DIAGNOSIS — R29898 Other symptoms and signs involving the musculoskeletal system: Secondary | ICD-10-CM

## 2014-09-22 DIAGNOSIS — M25661 Stiffness of right knee, not elsewhere classified: Secondary | ICD-10-CM

## 2014-09-22 DIAGNOSIS — Z9889 Other specified postprocedural states: Secondary | ICD-10-CM

## 2014-09-22 NOTE — Therapy (Signed)
[place], [place], [address] Phone: [phone]   Fax:  [phone]  Physical Therapy Treatment  Patient Details  Name: Eugene Hill MRN: [id number] Date of Birth: 08-Apr-1961 Referring Provider:  Carole Civil, MD  Encounter Date: 09/22/2014      PT End of Session - 09/22/14 1029    Visit Number 14   Number of Visits 18   Date for PT Re-Evaluation 10/03/14   Authorization Type UHC   Authorization - Visit Number 14   Authorization - Number of Visits 18   PT Start Time 0932   PT Stop Time 1018   PT Time Calculation (min) 46 min   Activity Tolerance Patient tolerated treatment well   Behavior During Therapy [facility] for tasks assessed/performed      Past Medical History  Diagnosis Date  . Hypertension   . Arthritis     Past Surgical History  Procedure Laterality Date  . Right knee arthroscopy  1987  . Left broken arm    . Neck surgery      cervical c4- c5  . Colonoscopy N/A 08/15/2013    Procedure: COLONOSCOPY;  Surgeon: Danie Binder, MD;  [facility];  Service: Endoscopy;  Laterality: N/A;  9:15 AM  . Knee arthroscopy with medial menisectomy Right 07/31/2014    Procedure: KNEE ARTHROSCOPY WITH MEDIAL AND LATERAL MENISECTOMY;  Surgeon: Carole Civil, MD;  Location: [facility];  Service: Orthopedics;  Laterality: Right;    There were no vitals filed for this visit.  Visit Diagnosis:  S/P right knee arthroscopy  Right knee pain  Weakness of right hip  Knee stiffness, right      Subjective Assessment - 09/22/14 0942    Symptoms Pt stated he returns to work on Thursday, feels he will not be free for PT sessions.  Pt. feel ready for discharge   How long can you stand comfortably? Able to stand comfortably for 5-6 hours   How long can you walk comfortably? Able to walk comfortably for several hours (one hour with no AD)   Currently in Pain? No/denies            [facility] PT  Assessment - 09/22/14 0001    Assessment   Medical Diagnosis Rt knee stiffness s/p Rt knee arthroscopy.    Onset Date 07/31/14   Next MD Visit [name], unscheduled   Prior Therapy no   AROM   Right Hip Internal Rotation  32  was 25   Left Hip Internal Rotation  35  was 25   Strength   Right Hip Flexion 5/5   Right Hip Extension 4+/5  was 4/5   Right Hip ABduction 5/5  was 4+/5   Left Hip Flexion 5/5   Left Hip Extension 4+/5  was 4+/5   Left Hip ABduction 5/5  was 4+/5   Right Knee Extension 5/5  was 4+/5                   [facility] Adult PT Treatment/Exercise - 09/22/14 0001    Exercises   Exercises Knee/Hip   Knee/Hip Exercises: Stretches   Active Hamstring Stretch 3 reps;30 seconds   Active Hamstring Stretch Limitations 3 way to 14 inch box    Quad Stretch 3 reps;30 seconds   Quad Stretch Limitations standing   Piriformis Stretch 3 reps;30 seconds   Piriformis Stretch Limitations standing beside mat   Gastroc Stretch 3 reps;30 seconds  Gastroc Stretch Limitations slant board, 3 way    Knee/Hip Exercises: Plyometrics   Other Plyometric Exercises agility ladder   Knee/Hip Exercises: Standing   Forward Lunges Limitations Lunge matrix common and uncommon 5x    Lateral Step Up Both;10 reps   Lateral Step Up Limitations lateral step up to 12in with IR to 17in then squat to simulate getting into forklift   Forward Step Up Both;10 reps   Forward Step Up Limitations 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    Baseline 09/15/2014- 0-129   Status Achieved   PT SHORT TERM GOAL #2   Title Patient  will be able to Flex Rt knee to 120 degrees to be able to squat to a chair without compensatign with weight shift to Lt LE.    Baseline 09/11/2014 0-129 degrees for Rt knee   Status Achieved   PT SHORT TERM GOAL #3   Title Patient will dmeosntrate Rt quadricep strength of 4/5 MMT to be bale to go up and down stairs with 1 HHA.    Status Achieved   PT SHORT TERM GOAL #4   Title Patient will be independent with HEP.    Status Achieved   PT SHORT TERM GOAL #5   Title patient will dmeosntrate increased hip internal rotation bilaterally to > 30 degrees.    Baseline 09/22/2014 Lt 35 degrees and Rt 32 degrees   Status Achieved           PT Long Term Goals - 09/22/14 0949    PT LONG TERM GOAL #1   Title Patient will be able to Flex Rt knee to 125 degrees in order to be symmetrical with L knee and to be able to kneel to the floor   Baseline 09/15/2014: Lt 131 degrees, Rt 129 degrees   Status Achieved   PT LONG TERM GOAL #2   Title Patient will demonstrate Rt quadricep strength of 5/5 MMT to be able to lift 50lb from the floor for work   Baseline 09/22/2014 Bil quad 5/5   Status Achieved   PT LONG TERM GOAL #3   Title Patient will dmeonstrate increased hip abductionstrength of 4/5 MMT to increase hip/knee stability during gait.    Baseline 09/08/2014 4+/5 Bilaterally   Status Achieved   PT LONG TERM GOAL #4   Title patient will be able to walk up and down stairs, ladders, crawl, and perform overhead lifting tasks with good biomechanics in order to safely return to work   Status Achieved   PT [place] #5   Title patient will be bale to craw, kneel , and stand up from floor without pain.    Status Achieved               Plan - 09/22/14 1029    Clinical Impression Statement Reviewed goals following reports of RTW this Thursday and pt does not feel he will available for therapy.  All STG and LTGs acheived.  Pt reports compliance  wth HEP daily and able to demonstrate or  verbalize proper technique with all exercises and stretches.  Able to demonstrate appropriate body mechanics lifting 50# from floor, able to perform kneeling to standing, crawling and able to demonstrate safe mechanics wth ladder.  MMT complete with 4+/5 or 5/5 for all LE musculature and improved hip mobility with Bil hip IR greater than 30 degrees.     PT Next Visit Plan Recommend D/C to HEP per all goals met.        Problem List Patient Active Problem List   Diagnosis Date Noted  . Lateral meniscal tear   . Acute medial meniscal tear   . Arthritis of knee, right   . Torn medial meniscus 07/20/2014  . Anterior cruciate ligament tear 07/20/2014  . Primary osteoarthritis of right knee 07/20/2014   Ihor Austin, [place] [address] [place], [place], [address] Phone: [phone]   Fax:  [phone]     PHYSICAL THERAPY DISCHARGE SUMMARY  Visits from Start of Care:14  Current functional level related to goals / functional outcomes: All goals met   Remaining deficits: atient ready to return to work, no pain, full strength.     Plan: Patient agrees to discharge.  Patient goals were met. Patient is being discharged due to meeting the stated rehab goals.  ?????   Devona Konig PT DPT [phone]

## 2014-09-24 ENCOUNTER — Ambulatory Visit (HOSPITAL_COMMUNITY): Payer: 59 | Admitting: Physical Therapy

## 2014-09-29 ENCOUNTER — Ambulatory Visit (HOSPITAL_COMMUNITY): Payer: 59

## 2014-10-01 ENCOUNTER — Encounter (HOSPITAL_COMMUNITY): Payer: 59 | Admitting: Physical Therapy

## 2015-09-16 ENCOUNTER — Telehealth: Payer: Self-pay | Admitting: *Deleted

## 2015-09-16 MED ORDER — NAPROXEN 500 MG PO TABS: 500.0000 mg | ORAL_TABLET | Freq: Two times a day (BID) | ORAL | Status: DC

## 2015-09-16 NOTE — Addendum Note (Signed)
Addended by: Willette Pa on: 09/16/2015 03:58 PM   Modules accepted: Orders, Medications

## 2015-09-16 NOTE — Telephone Encounter (Signed)
Refill request for naproxen 500mg  one tab by mouth twice daily after meals #60

## 2015-09-16 NOTE — Telephone Encounter (Signed)
Rx done. 

## 2016-07-14 DIAGNOSIS — I10 Essential (primary) hypertension: Secondary | ICD-10-CM | POA: Diagnosis not present

## 2016-07-14 DIAGNOSIS — R7301 Impaired fasting glucose: Secondary | ICD-10-CM | POA: Diagnosis not present

## 2016-07-14 DIAGNOSIS — E782 Mixed hyperlipidemia: Secondary | ICD-10-CM | POA: Diagnosis not present

## 2016-07-21 DIAGNOSIS — N400 Enlarged prostate without lower urinary tract symptoms: Secondary | ICD-10-CM | POA: Diagnosis not present

## 2016-07-21 DIAGNOSIS — I10 Essential (primary) hypertension: Secondary | ICD-10-CM | POA: Diagnosis not present

## 2016-07-21 DIAGNOSIS — E782 Mixed hyperlipidemia: Secondary | ICD-10-CM | POA: Diagnosis not present

## 2016-08-14 DIAGNOSIS — I10 Essential (primary) hypertension: Secondary | ICD-10-CM | POA: Diagnosis not present

## 2016-09-28 DIAGNOSIS — I10 Essential (primary) hypertension: Secondary | ICD-10-CM | POA: Diagnosis not present

## 2016-11-02 ENCOUNTER — Emergency Department (HOSPITAL_COMMUNITY): Payer: 59

## 2016-11-02 ENCOUNTER — Emergency Department (HOSPITAL_COMMUNITY)
Admission: EM | Admit: 2016-11-02 | Discharge: 2016-11-02 | Disposition: A | Discharge status: Home / Self Care | Payer: 59 | Attending: Emergency Medicine | Admitting: Emergency Medicine

## 2016-11-02 ENCOUNTER — Encounter (HOSPITAL_COMMUNITY): Payer: Self-pay | Admitting: *Deleted

## 2016-11-02 DIAGNOSIS — Z7982 Long term (current) use of aspirin: Secondary | ICD-10-CM | POA: Diagnosis not present

## 2016-11-02 DIAGNOSIS — J209 Acute bronchitis, unspecified: Secondary | ICD-10-CM | POA: Diagnosis not present

## 2016-11-02 DIAGNOSIS — J029 Acute pharyngitis, unspecified: Secondary | ICD-10-CM | POA: Diagnosis present

## 2016-11-02 DIAGNOSIS — J039 Acute tonsillitis, unspecified: Secondary | ICD-10-CM | POA: Insufficient documentation

## 2016-11-02 DIAGNOSIS — Z79899 Other long term (current) drug therapy: Secondary | ICD-10-CM | POA: Insufficient documentation

## 2016-11-02 DIAGNOSIS — I10 Essential (primary) hypertension: Secondary | ICD-10-CM | POA: Insufficient documentation

## 2016-11-02 DIAGNOSIS — M542 Cervicalgia: Secondary | ICD-10-CM | POA: Diagnosis not present

## 2016-11-02 MED ORDER — IOPAMIDOL (ISOVUE-300) INJECTION 61%
75.0000 mL | Freq: Once | INTRAVENOUS | Status: AC | PRN
  Administered 2016-11-02: 75 mL via INTRAVENOUS

## 2016-11-02 MED ORDER — AMOXICILLIN 250 MG PO CAPS
500.0000 mg | ORAL_CAPSULE | Freq: Once | ORAL | Status: AC
  Administered 2016-11-02: 500 mg via ORAL
  Filled 2016-11-02: qty 2

## 2016-11-02 MED ORDER — PROMETHAZINE HCL 12.5 MG PO TABS
12.5000 mg | ORAL_TABLET | Freq: Once | ORAL | Status: AC
  Administered 2016-11-02: 12.5 mg via ORAL
  Filled 2016-11-02: qty 1

## 2016-11-02 MED ORDER — IBUPROFEN 600 MG PO TABS
600.0000 mg | ORAL_TABLET | Freq: Four times a day (QID) | ORAL | 0 refills | Status: DC
Start: 2016-11-02 — End: 2019-02-04

## 2016-11-02 MED ORDER — AMOXICILLIN 500 MG PO CAPS: 500.0000 mg | ORAL_CAPSULE | Freq: Three times a day (TID) | ORAL | 0 refills | Status: DC

## 2016-11-02 MED ORDER — HYDROCODONE-ACETAMINOPHEN 5-325 MG PO TABS: 1.0000 | ORAL_TABLET | ORAL | 0 refills | Status: DC | PRN

## 2016-11-02 MED ORDER — IBUPROFEN 800 MG PO TABS
800.0000 mg | ORAL_TABLET | Freq: Once | ORAL | Status: AC
  Administered 2016-11-02: 800 mg via ORAL
  Filled 2016-11-02: qty 1

## 2016-11-02 MED ORDER — HYDROCODONE-ACETAMINOPHEN 5-325 MG PO TABS
1.0000 | ORAL_TABLET | Freq: Once | ORAL | Status: AC
  Administered 2016-11-02: 1 via ORAL
  Filled 2016-11-02: qty 1

## 2016-11-02 NOTE — ED Provider Notes (Signed)
[facility] DEPT Provider Note   CSN: [id number] Arrival date & time: 11/02/16  1929     History   Chief Complaint Chief Complaint  Patient presents with  . Sore Throat    HPI Eugene Hill is a 56 y.o. male.  Patient is a 56 year old male who presents to the emergency department with a complaint of sore throat and left neck pain.  The patient states that he was in his usual state of good health up until earlier today when he started having problems with sore throat. This was quickly followed by pain in the left neck area. He felt a knot on the left side of his neck. He later had pain from the left knot area to the jaw area to the left ear. He says he feels as though someone is stabbing him through his ear. He's not had any drainage from the ear. He's not had any high fever reported. The pain is worse when he swallows on, but rest does not resolve the pain completely. He's not had any recent operations or procedures involving the neck or ear or throat area.      Past Medical History:  Diagnosis Date  . Arthritis   . Hypertension     Patient Active Problem List   Diagnosis Date Noted  . Lateral meniscal tear   . Acute medial meniscal tear   . Arthritis of knee, right   . Torn medial meniscus 07/20/2014  . Anterior cruciate ligament tear 07/20/2014  . Primary osteoarthritis of right knee 07/20/2014    Past Surgical History:  Procedure Laterality Date  . COLONOSCOPY N/A 08/15/2013   Procedure: COLONOSCOPY;  Surgeon: Danie Binder, MD;  [facility];  Service: Endoscopy;  Laterality: N/A;  9:15 AM  . KNEE ARTHROSCOPY WITH MEDIAL MENISECTOMY Right 07/31/2014   Procedure: KNEE ARTHROSCOPY WITH MEDIAL AND LATERAL MENISECTOMY;  Surgeon: Carole Civil, MD;  Location: [facility];  Service: Orthopedics;  Laterality: Right;  . Left broken arm    . NECK SURGERY     cervical c4- c5  . Right knee arthroscopy  1987       Home Medications    Prior to Admission  medications   Medication Sig Start Date End Date Taking? Authorizing Provider  amLODipine (NORVASC) 5 MG tablet Take 5 mg by mouth daily.    [provider]  Aspirin-Acetaminophen-Caffeine (GOODY HEADACHE PO) Take 1 packet by mouth daily as needed. headaches    [provider]  HYDROcodone-acetaminophen (NORCO) 10-325 MG per tablet Take 1 tablet by mouth every 4 (four) hours as needed. 07/31/14   Carole Civil, MD  naproxen (NAPROSYN) 500 MG tablet Take 1 tablet (500 mg total) by mouth 2 (two) times daily with a meal. 09/16/15   Sanjuana Kava, MD  promethazine (PHENERGAN) 12.5 MG tablet Take 1 tablet (12.5 mg total) by mouth every 6 (six) hours as needed for nausea or vomiting. Patient not taking: Reported on 08/24/2014 07/31/14   Carole Civil, MD    Family History Family History  Problem Relation Age of Onset  . Colon cancer Neg Hx     Social History Social History  Substance Use Topics  . Smoking status: Never Smoker  . Smokeless tobacco: Never Used  . Alcohol use 3.6 oz/week    6 Cans of beer per week     Allergies   Patient has no known allergies.   Review of Systems Review of Systems  HENT: Positive for  congestion, postnasal drip and sore throat.   Musculoskeletal: Positive for neck pain.  All other systems reviewed and are negative.    Physical Exam Updated Vital Signs BP 126/90 (BP Location: Right Arm)   Pulse 81   Temp 98.2 F (36.8 C) (Oral)   Resp 20   Ht 5\' 10"  (1.778 m)   Wt 111.1 kg   SpO2 96%   BMI 35.15 kg/m   Physical Exam  Constitutional: He is oriented to person, place, and time. He appears well-developed and well-nourished.  Non-toxic appearance.  HENT:  Head: Normocephalic.  Right Ear: Tympanic membrane and external ear normal.  Left Ear: Tympanic membrane and external ear normal.  There is mild enlargement of the tonsils and uvula. There is some increased redness present. The airway is patent. The tongue is in the  midline.  There is nasal congestion present.  There no temperature changes of the left versus right sides of the face.  Eyes: EOM and lids are normal. Pupils are equal, round, and reactive to light.  Neck: Normal range of motion. Neck supple. Carotid bruit is not present.  There is a swollen tender submental node on the left. There is soreness with opening and closing the mouth at the TMJ area. There is no click or crepitus of the temporomandibular joint area. No other swollen glands appreciated on the neck examination. Trachea is midline.  Cardiovascular: Normal rate, regular rhythm, normal heart sounds, intact distal pulses and normal pulses.   Pulmonary/Chest: Breath sounds normal. No respiratory distress.  Abdominal: Soft. Bowel sounds are normal. There is no tenderness. There is no guarding.  Musculoskeletal: Normal range of motion.  Lymphadenopathy:       Head (right side): No submandibular adenopathy present.       Head (left side): No submandibular adenopathy present.    He has no cervical adenopathy.  Neurological: He is alert and oriented to person, place, and time. He has normal strength. No cranial nerve deficit or sensory deficit.  Skin: Skin is warm and dry.  Psychiatric: He has a normal mood and affect. His speech is normal.  Nursing note and vitals reviewed.    ED Treatments / Results  Labs (all labs ordered are listed, but only abnormal results are displayed) Labs Reviewed - No data to display  EKG  EKG Interpretation None       Radiology No results found.  Procedures Procedures (including critical care time)  Medications Ordered in ED Medications - No data to display   Initial Impression / Assessment and Plan / ED Course  I have reviewed the triage vital signs and the nursing notes.  Pertinent labs & imaging results that were available during my care of the patient were reviewed by me and considered in my medical decision making (see chart for  details).       Final Clinical Impressions(s) / ED Diagnoses MDM Blood pressure is elevated at 143/93, otherwise vital signs within normal limits. The patient speaks clearly. He is able to swallow liquids. CT scan soft tissue neck shows mildly enlarge Pal[place] and lingual tonsils. It was felt the patient may have an acute tonsillopharyngitis no abscess appreciated.  Patient treated with amoxicillin and ibuprofen AND hydrocodone. Patient will follow with primary physician or ear nose and throat specialist if not improving.    Final diagnoses:  Tonsillitis  Pharyngitis, unspecified etiology    New Prescriptions Discharge Medication List as of 11/02/2016 11:02 PM    START taking these medications  Details  amoxicillin (AMOXIL) 500 MG capsule Take 1 capsule (500 mg total) by mouth 3 (three) times daily., Starting Thu 11/02/2016, Print    HYDROcodone-acetaminophen (NORCO/VICODIN) 5-325 MG tablet Take 1 tablet by mouth every 4 (four) hours as needed., Starting Thu 11/02/2016, Print    ibuprofen (ADVIL,MOTRIN) 600 MG tablet Take 1 tablet (600 mg total) by mouth 4 (four) times daily., Starting Thu 11/02/2016, Print         Lily Kocher, PA-C 11/04/16 [place], [place], DO 11/05/16 1551

## 2016-11-02 NOTE — ED Notes (Signed)
ED Provider at bedside. 

## 2016-11-02 NOTE — ED Notes (Signed)
Patient transported to CT 

## 2016-11-02 NOTE — ED Triage Notes (Signed)
Pt c/o sore throat to left side of throat that started when he woke up this am and has gotten progressively worse throughout the day; pt states he has a knot to left side of throat; pt states it is very painful to swallow

## 2016-11-02 NOTE — Discharge Instructions (Signed)
Your vital signs within normal limits. Your oxygen level is 96% on room air. The CT scan shows some swelling of the lingula tonsil(area under the tongue). Your examination shows swelling of your tonsils and uvula with increased redness. Please use salt water gargles 2 times daily. Use 600 mg of ibuprofen with breakfast, lunch, dinner, and bedtime. Please use Amoxil 3 times daily with food. May use Norco for more severe pain. Please follow-up with Dr. Nevada Crane to reassess these areas and to ensure that the infection has resolved.

## 2016-11-13 ENCOUNTER — Telehealth: Payer: Self-pay | Admitting: Orthopaedic Surgery

## 2016-11-13 MED ORDER — NAPROXEN 500 MG PO TABS: 500.0000 mg | ORAL_TABLET | Freq: Two times a day (BID) | ORAL | 5 refills | Status: DC

## 2016-11-13 NOTE — Telephone Encounter (Signed)
Naproxen  500 MG  60 Tablets

## 2017-01-11 DIAGNOSIS — N400 Enlarged prostate without lower urinary tract symptoms: Secondary | ICD-10-CM | POA: Diagnosis not present

## 2017-01-11 DIAGNOSIS — R7301 Impaired fasting glucose: Secondary | ICD-10-CM | POA: Diagnosis not present

## 2017-01-11 DIAGNOSIS — I10 Essential (primary) hypertension: Secondary | ICD-10-CM | POA: Diagnosis not present

## 2017-01-11 DIAGNOSIS — Z1159 Encounter for screening for other viral diseases: Secondary | ICD-10-CM | POA: Diagnosis not present

## 2017-01-16 DIAGNOSIS — R7301 Impaired fasting glucose: Secondary | ICD-10-CM | POA: Diagnosis not present

## 2017-01-16 DIAGNOSIS — E782 Mixed hyperlipidemia: Secondary | ICD-10-CM | POA: Diagnosis not present

## 2017-01-16 DIAGNOSIS — I10 Essential (primary) hypertension: Secondary | ICD-10-CM | POA: Diagnosis not present

## 2017-07-18 DIAGNOSIS — I10 Essential (primary) hypertension: Secondary | ICD-10-CM | POA: Diagnosis not present

## 2017-07-18 DIAGNOSIS — R5383 Other fatigue: Secondary | ICD-10-CM | POA: Diagnosis not present

## 2017-07-18 DIAGNOSIS — R945 Abnormal results of liver function studies: Secondary | ICD-10-CM | POA: Diagnosis not present

## 2017-07-18 DIAGNOSIS — E782 Mixed hyperlipidemia: Secondary | ICD-10-CM | POA: Diagnosis not present

## 2017-07-18 DIAGNOSIS — M150 Primary generalized (osteo)arthritis: Secondary | ICD-10-CM | POA: Diagnosis not present

## 2017-07-18 DIAGNOSIS — R7301 Impaired fasting glucose: Secondary | ICD-10-CM | POA: Diagnosis not present

## 2017-07-23 DIAGNOSIS — I10 Essential (primary) hypertension: Secondary | ICD-10-CM | POA: Diagnosis not present

## 2017-08-15 IMAGING — CT CT NECK W/ CM
3 series · 16 of 33 positions shown, 19 images · IV contrast (iopamidol)
Comparison: None.

CLINICAL DATA: Left neck and jaw pain.  Sore throat.

EXAM:
CT NECK WITH CONTRAST
TECHNIQUE: Multidetector CT imaging of the neck was performed using the
standard protocol following the bolus administration of intravenous
contrast.
CONTRAST:  75mL 05UCZO-L99 IOPAMIDOL (05UCZO-L99) INJECTION 61%

[Series 2: axial neck · axial · 0.57mm/px · z∈[-43,+195]mm · 8 of 141 slices shown, 10 images]
[im 11/141  soft-tissue]
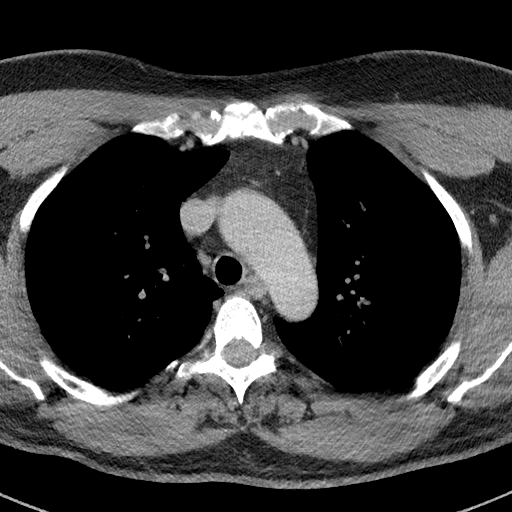
[im 11/141  bone]
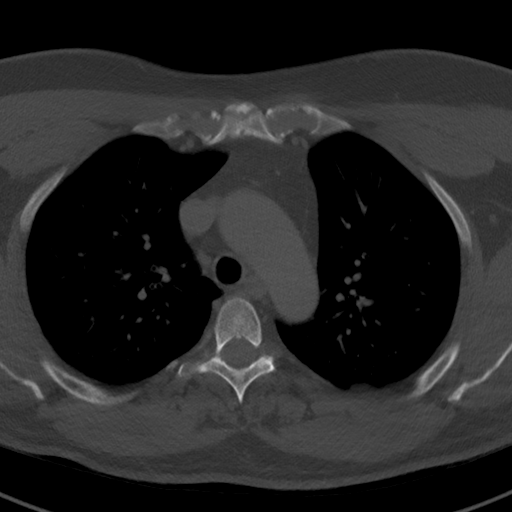
[im 33/141  bone]
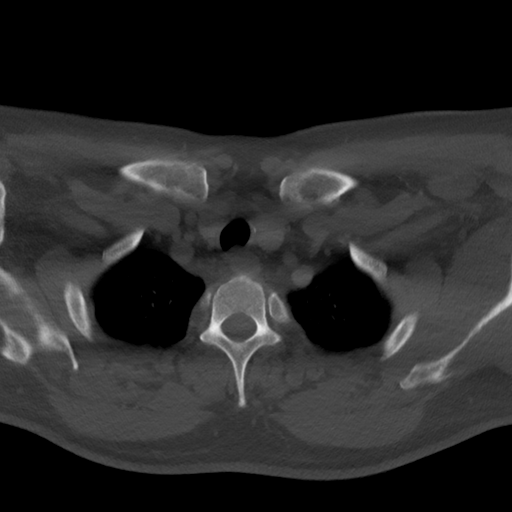
[im 44/141  bone]
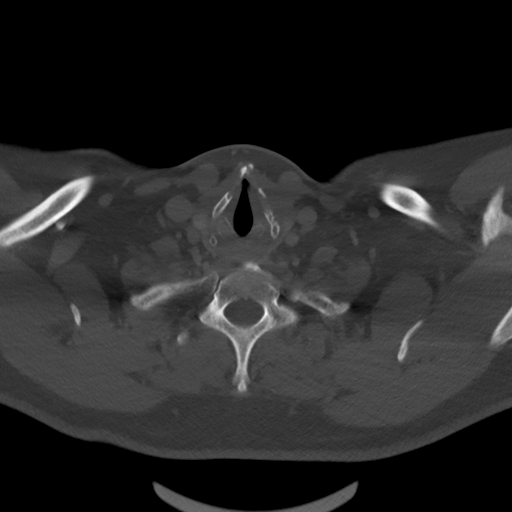
[im 65/141  bone]
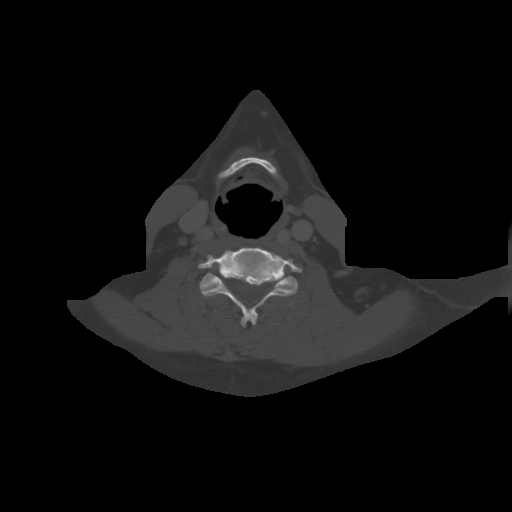
[im 76/141  soft-tissue]
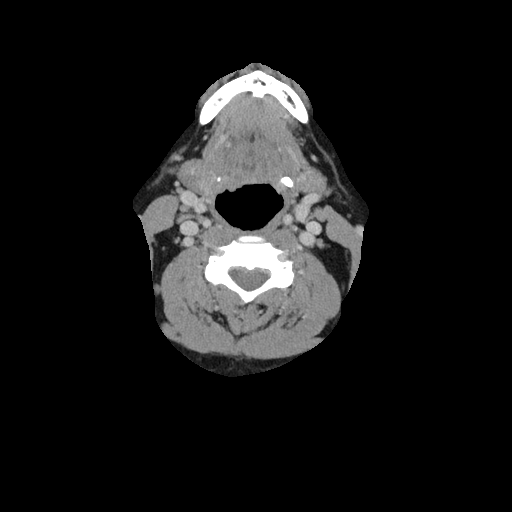
[im 76/141  bone]
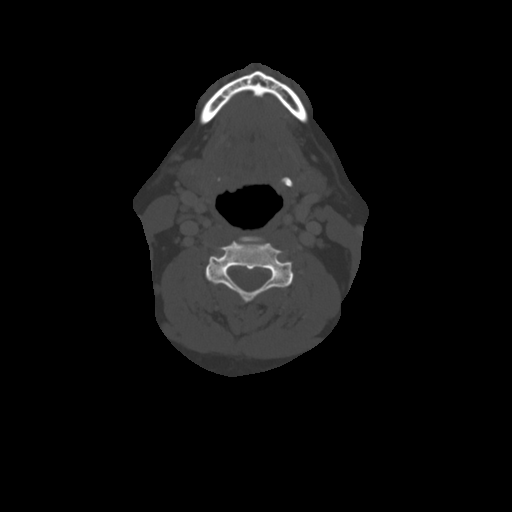
[im 97/141  bone]
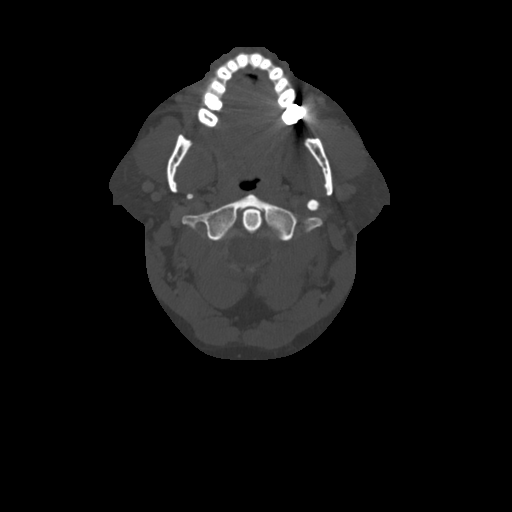
[im 108/141  bone]
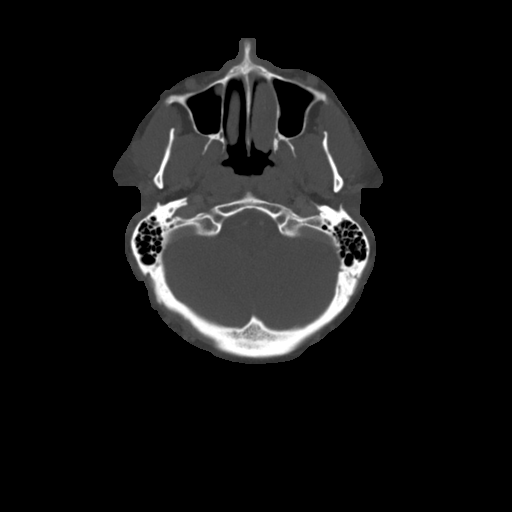
[im 130/141  bone]
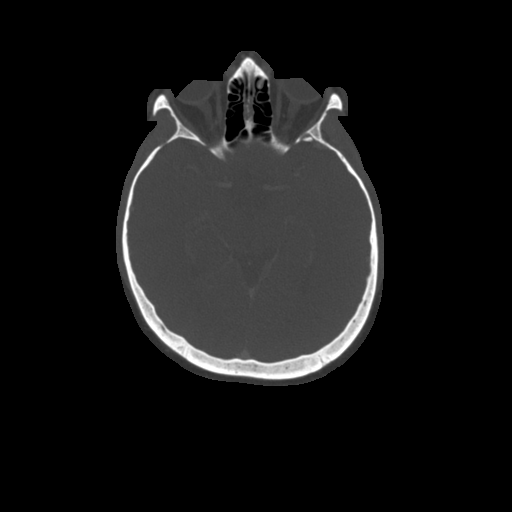

[Series 6: coronal neck · coronal · 0.55mm/px · 3 of 123 slices shown]
[im 25/123  bone]
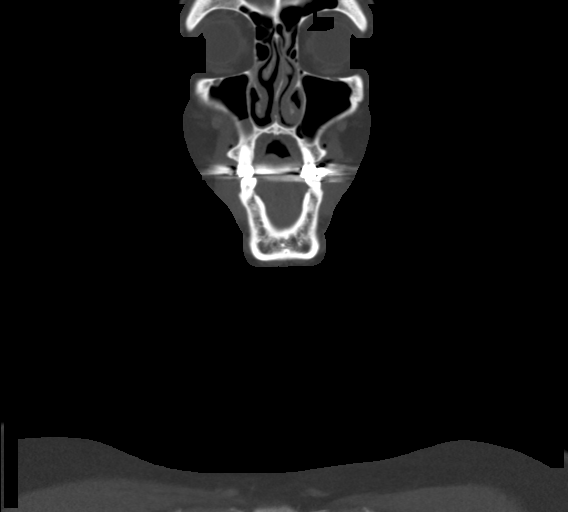
[im 49/123  bone]
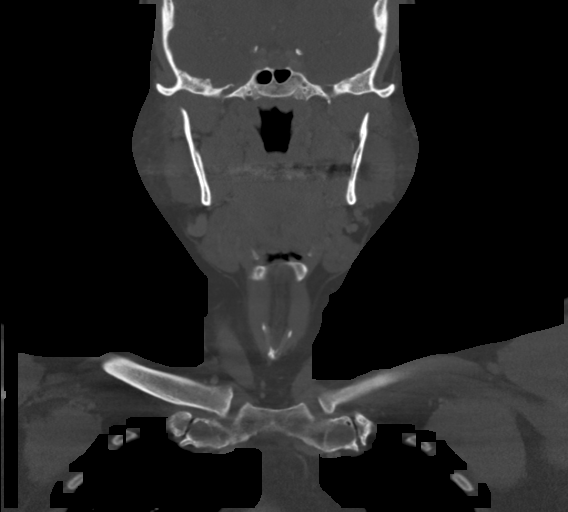
[im 74/123  bone]
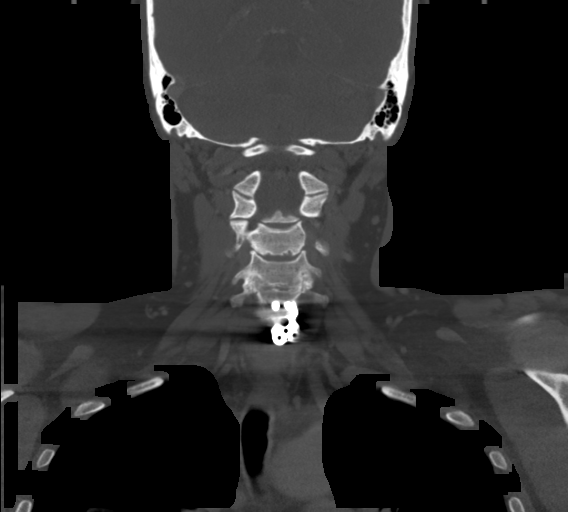

[Series 7: sagittal neck · sagittal · 0.55mm/px · 5 of 101 slices shown, 6 images]
[im 34/101  bone]
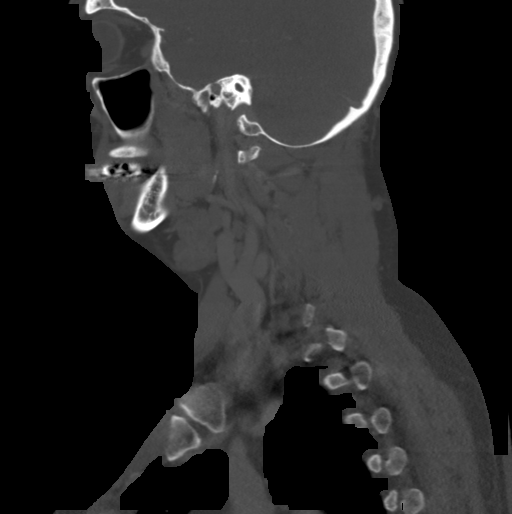
[im 42/101  bone]
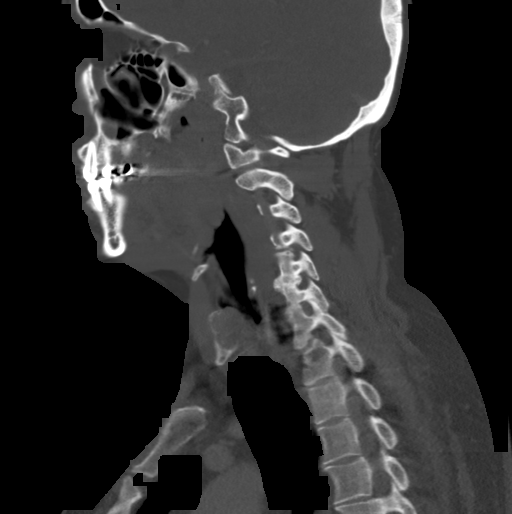
[im 51/101  soft-tissue]
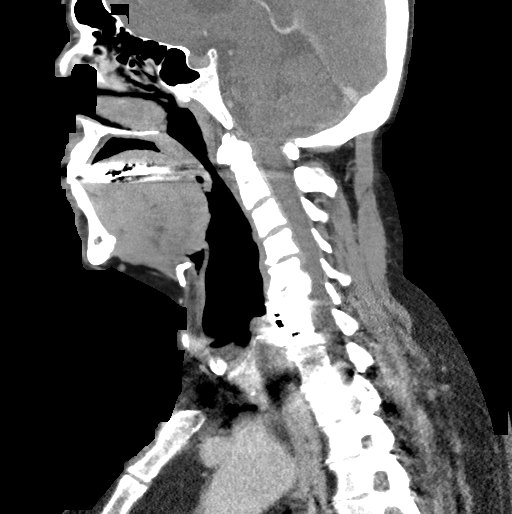
[im 51/101  bone]
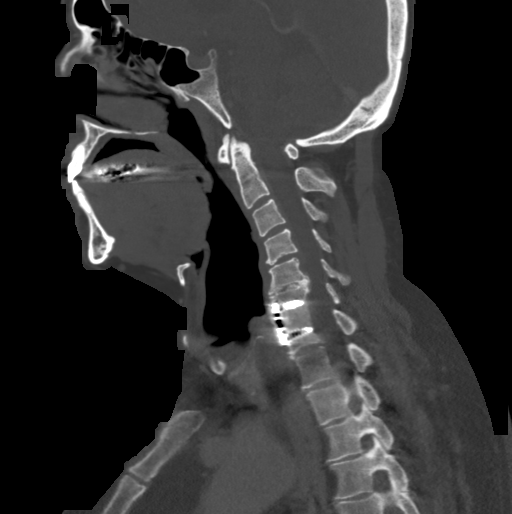
[im 59/101  bone]
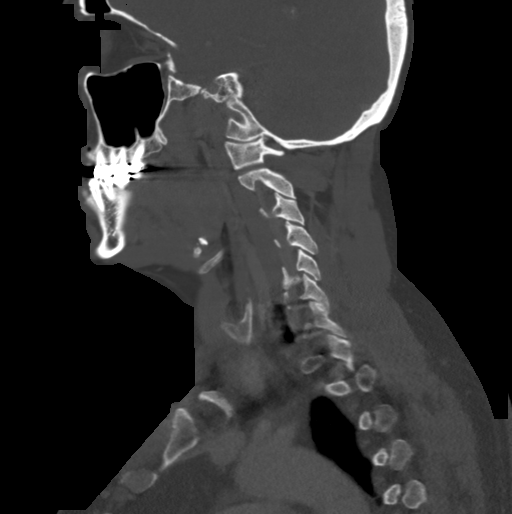
[im 67/101  bone]
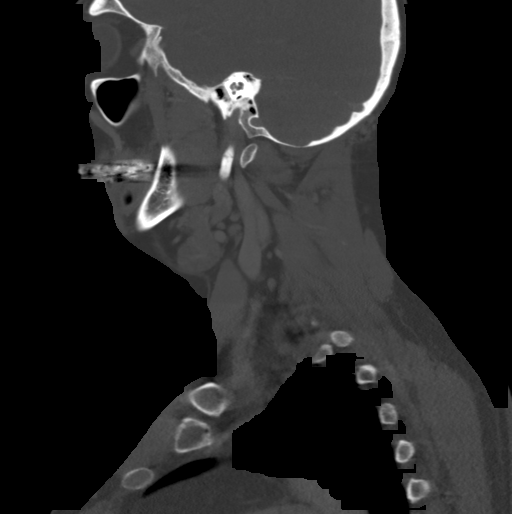

[16 of 33 positions shown; findings below may reference images not displayed]

FINDINGS: Pharynx and larynx: The palatine and lingual tonsils are mildly
edematous for age. There is no retropharyngeal abscess, effusion or
lymphadenopathy. The visible portion of the oral cavity is normal.
The epiglottis and larynx are normal. No peritonsillar abscess.

Salivary glands: The parotid, sublingual and submandibular glands
are normal. No sialolithiasis or salivary ductal dilatation.

Thyroid: Normal

Lymph nodes: No enlarged or abnormal density cervical lymph nodes.

Vascular: Normal appearance of the major cervical vessels.

Limited intracranial: Normal

Visualized orbits: Normal

Mastoids and visualized paranasal sinuses: Clear

Skeleton: C6-7 solid fusion anteriorly with ACDF hardware. Adjacent
segment disease at C5-6 with disc osteophyte complex mildly
narrowing the spinal canal.

Upper chest: Clear

Other: None
IMPRESSION: 1. Mildly enlarged palatine and lingual tonsils may indicate acute
tonsillopharyngitis. No peritonsillar or retropharyngeal abscess.
2. No cervical lymphadenopathy.

## 2018-01-02 DIAGNOSIS — I10 Essential (primary) hypertension: Secondary | ICD-10-CM | POA: Diagnosis not present

## 2018-01-02 DIAGNOSIS — R7301 Impaired fasting glucose: Secondary | ICD-10-CM | POA: Diagnosis not present

## 2018-01-29 DIAGNOSIS — I10 Essential (primary) hypertension: Secondary | ICD-10-CM | POA: Diagnosis not present

## 2018-01-29 DIAGNOSIS — R7301 Impaired fasting glucose: Secondary | ICD-10-CM | POA: Diagnosis not present

## 2018-08-05 DIAGNOSIS — E6609 Other obesity due to excess calories: Secondary | ICD-10-CM | POA: Diagnosis not present

## 2018-08-05 DIAGNOSIS — Z6837 Body mass index (BMI) 37.0-37.9, adult: Secondary | ICD-10-CM | POA: Diagnosis not present

## 2018-08-05 DIAGNOSIS — I10 Essential (primary) hypertension: Secondary | ICD-10-CM | POA: Diagnosis not present

## 2018-08-05 DIAGNOSIS — N529 Male erectile dysfunction, unspecified: Secondary | ICD-10-CM | POA: Diagnosis not present

## 2018-08-14 DIAGNOSIS — I10 Essential (primary) hypertension: Secondary | ICD-10-CM | POA: Diagnosis not present

## 2018-08-14 DIAGNOSIS — Z0000 Encounter for general adult medical examination without abnormal findings: Secondary | ICD-10-CM | POA: Diagnosis not present

## 2018-08-14 DIAGNOSIS — R7301 Impaired fasting glucose: Secondary | ICD-10-CM | POA: Diagnosis not present

## 2019-02-04 ENCOUNTER — Telehealth: Payer: Self-pay | Admitting: Neurology

## 2019-02-04 ENCOUNTER — Other Ambulatory Visit: Payer: Self-pay

## 2019-02-04 ENCOUNTER — Encounter: Payer: Self-pay | Admitting: Neurology

## 2019-02-04 ENCOUNTER — Ambulatory Visit: Payer: 59 | Admitting: Neurology

## 2019-02-04 VITALS — BP 148/95 | HR 77 | Temp 97.1°F | Ht 70.0 in | Wt 232.0 lb

## 2019-02-04 DIAGNOSIS — R413 Other amnesia: Secondary | ICD-10-CM

## 2019-02-04 DIAGNOSIS — G3184 Mild cognitive impairment, so stated: Secondary | ICD-10-CM | POA: Diagnosis not present

## 2019-02-04 MED ORDER — FISH OIL 1000 MG PO CAPS: 1.0000 | ORAL_CAPSULE | ORAL | 0 refills | Status: DC

## 2019-02-04 MED ORDER — FISH OIL 1000 MG PO CAPS: 1.0000 | ORAL_CAPSULE | ORAL | 0 refills | Status: AC

## 2019-02-04 NOTE — Progress Notes (Signed)
[facility] [address] [place]. [place] [address] [id number]       OFFICE CONSULT NOTE  Eugene Hill Date of Birth:  Jun 09, 1961 Medical Record Number:  [id number]   Referring MD: Wende Neighbors Reason for Referral: Memory loss  HPI: Eugene Hill is a pleasant 58 year old Caucasian male seen today for initial office consultation visit for memory loss.  Is accompanied by his wife.  History is obtained from them as well as review of referral notes.  Patient states he is noticed some short-term memory difficulties for the last 5 to 7 months.  He has been noticing that he is finding a hard time to recall work order at work.  This is usually 6 digits and he has trouble remembering the last few digits.  He has some good days and bad days and can function fairly well on most days but there are clearly days when he has trouble even staying focused at work and difficulty managing his task.  He has trouble remembering new names.  His wife often has to remind him about certain tasks and events that he supposed to do.  Patient has at times also misplaces keys as well as has trouble balancing his checkbook's.  He still independent and doing all his activities including working and driving.  He denies any headaches, dizziness, loss of vision, gait or balance difficulties or weakness.  He denies any history of strokes TIAs seizures significant head injury with loss of consciousness.  1 paternal uncle had dementia.  He has a not had any recent lab work or brain imaging studies done.  He has not been taking any memory enhancement supplements or medications.  He denies feeling depressed or anxious.  ROS:   14 system review of systems is positive for memory difficulties, forgetfulness, misplacing objects and all other systems negative  PMH:  Past Medical History:  Diagnosis Date  . Arthritis   . Hypertension     Social History:  Social History   Socioeconomic History  . Marital  status: Divorced    Spouse name: Not on file  . Number of children: Not on file  . Years of education: Not on file  . Highest education level: Not on file  Occupational History  . Not on file  Social Needs  . Financial resource strain: Not on file  . Food insecurity    Worry: Not on file    Inability: Not on file  . Transportation needs    Medical: Not on file    Non-medical: Not on file  Tobacco Use  . Smoking status: Never Smoker  . Smokeless tobacco: Current User    Types: Chew  Substance and Sexual Activity  . Alcohol use: Yes    Alcohol/week: 6.0 standard drinks    Types: 6 Cans of beer per week  . Drug use: No  . Sexual activity: Yes    Birth control/protection: None  Lifestyle  . Physical activity    Days per week: Not on file    Minutes per session: Not on file  . Stress: Not on file  Relationships  . Social Herbalist on phone: Not on file    Gets together: Not on file    Attends religious service: Not on file    Active member of club or organization: Not on file    Attends meetings of clubs or organizations: Not on file    Relationship status: Not on file  . Intimate  partner violence    Fear of current or ex partner: Not on file    Emotionally abused: Not on file    Physically abused: Not on file    Forced sexual activity: Not on file  Other Topics Concern  . Not on file  Social History Narrative  . Not on file    Medications:   Current Outpatient Medications on File Prior to Visit  Medication Sig Dispense Refill  . Multiple Vitamin (MULTIVITAMIN) tablet Take 1 tablet by mouth daily.    Marland Kitchen olmesartan-hydrochlorothiazide (BENICAR HCT) 20-12.5 MG tablet Take 1 tablet by mouth as needed.    . Turmeric (QC TUMERIC COMPLEX) 500 MG CAPS Take by mouth.     No current facility-administered medications on file prior to visit.     Allergies:  No Known Allergies  Physical Exam General: Mildly obese pleasant middle-aged Caucasian male, seated, in  no evident distress Head: head normocephalic and atraumatic.   Neck: supple with no carotid or supraclavicular bruits Cardiovascular: regular rate and rhythm, no murmurs Musculoskeletal: no deformity Skin:  no rash/petichiae Vascular:  Normal pulses all extremities  Neurologic Exam Mental Status: Awake and fully alert. Oriented to place and time. Recent and remote memory intact. Attention span, concentration and fund of knowledge appropriate. Mood and affect appropriate.  Mini-Mental status exam score 30/30.  On Montreal cognitive assessment test [facility]) he scored 29/30 with one deficit in recall only.  Able to name 15 animals which walk on 4 legs.  Clock drawing 4/4.  Geriatric depression scale not depressed. Cranial Nerves: Fundoscopic exam reveals sharp disc margins. Pupils equal, briskly reactive to light. Extraocular movements full without nystagmus. Visual fields full to confrontation. Hearing intact. Facial sensation intact. Face, tongue, palate moves normally and symmetrically.  Motor: Normal bulk and tone. Normal strength in all tested extremity muscles. Sensory.: intact to touch , pinprick , position and vibratory sensation.  Coordination: Rapid alternating movements normal in all extremities. Finger-to-nose and heel-to-shin performed accurately bilaterally. Gait and Station: Arises from chair without difficulty. Stance is normal. Gait demonstrates normal stride length and balance . Able to heel, toe and tandem walk without difficulty.  Reflexes: 1+ and symmetric. Toes downgoing.       ASSESSMENT: 58 year old Caucasian male with subacute symptoms of mild short-term memory difficulties possibly from mild cognitive impairment.     PLAN: I had a long discussion with the patient and his wife regarding his mild subjective cognitive difficulties and memory loss likely being mild cognitive impairment.  I recommend further evaluation by checking lab work for reversible causes of memory  loss, EEG and MRI scan of the brain with and without contrast.  We discussed memory compensation strategies.  Recommend he start taking fish oil 1000 mg   daily as well.  Recommend trying Cerefolin NAC but he wants to wait on that for now. we also discussed increase participation in cognitively challenging activities like solving crossword puzzles, playing bridge and sudoku.  May consider referral for detailed neuropsychological testing in the future if no reversible etiology is found on above testing.  Greater than 50% time during this 45-minute consultation visit was spent on counseling and coordination of care about his memory loss and mild cognitive impairment and answering questions.  He will return for follow-up in 2 months or call earlier if necessary. Antony Contras, MD  [facility] [address] [place], [place] [address]  Phone [phone] Fax [phone] Note: This document was prepared with digital dictation and  possible smart Company secretary. Any transcriptional errors that result from this process are unintentional.

## 2019-02-04 NOTE — Patient Instructions (Signed)
I had a long discussion with the patient and his wife regarding his mild subjective cognitive difficulties and memory loss likely being mild cognitive impairment.  I recommend further evaluation by checking lab work for reversible causes of memory loss, EEG and MRI scan of the brain with and without contrast.  We discussed memory compensation strategies.  Recommend he start taking fish oil 1000 mg   daily as well.  Recommend trying Cerefolin NAC but he wants to wait on that for now. we also discussed increase participation in cognitively challenging activities like solving crossword puzzles, playing bridge and sudoku.  May consider referral for detailed neuropsychological testing in the future if no reversible etiology is found on above testing.  He will return for follow-up in 2 months or call earlier if necessary. Memory Compensation Strategies  1. Use "WARM" strategy.  W= write it down  A= associate it  R= repeat it  M= make a mental note  2.   You can keep a Social worker.  Use a 3-ring notebook with sections for the following: calendar, important names and phone numbers,  medications, doctors' names/phone numbers, lists/reminders, and a section to journal what you did  each day.   3.    Use a calendar to write appointments down.  4.    Write yourself a schedule for the day.  This can be placed on the calendar or in a separate section of the Memory Notebook.  Keeping a  regular schedule can help memory.  5.    Use medication organizer with sections for each day or morning/evening pills.  You may need help loading it  6.    Keep a basket, or pegboard by the door.  Place items that you need to take out with you in the basket or on the pegboard.  You may also want to  include a message board for reminders.  7.    Use sticky notes.  Place sticky notes with reminders in a place where the task is performed.  For example: " turn off the  stove" placed by the stove, "lock the door" placed on  the door at eye level, " take your medications" on  the bathroom mirror or by the place where you normally take your medications.  8.    Use alarms/timers.  Use while cooking to remind yourself to check on food or as a reminder to take your medicine, or as a  reminder to make a call, or as a reminder to perform another task, etc.  Mild Neurocognitive Disorder Mild neurocognitive disorder (formerly known as mild cognitive impairment) is a disorder in which memory does not work as well as it should. This disorder may also affect other mental functions, including thought, communication, behavior, and completion of tasks. These impairments are noticeable and measurable, but for the most part they do not interfere with daily activities or the ability to live independently. Mild neurocognitive disorder typically occurs in people older than 60 years but can occur earlier. It is not as serious as major neurocognitive disorder (formerly known as dementia), but it may be the first sign of it. Generally, symptoms of this condition get worse over time. In rare cases, symptoms can get better. What are the causes? This condition may be caused by:  Brain disorders associated with abnormal protein deposits in the brain, such as: ? Alzheimer disease. ? Frontotemporal dementia. ? Dementia with Lewy bodies.  Brain disorders associated with abnormal movement, such as Parkinson disease or Huntington  disease.  Diseases that affect blood vessels in the brain and result in small strokes.  Certain infections, such as HIV.  Traumatic brain injury.  Other medical conditions, such as brain tumors, under-active thyroid (hypothyroidism), and vitamin B12 deficiency.  Use of certain drugs or prescription medicines.  Untreated sleep apnea.  Heart disease, lung disease, liver disease, or kidney disease. What are the signs or symptoms? Symptoms of this condition include:  Difficulty remembering. You may: ? Forget  details of recent events, names, or phone numbers. ? Forget social events and appointments. ? Repeatedly forget where you put your car keys or other items.  Difficulty thinking and solving problems. You may have trouble with complex tasks, such as: ? Paying bills. ? Driving in unfamiliar places.  Difficulty communicating. You may have trouble: ? Finding the right word or naming an object. ? Forming a sentence that makes sense, or understanding what you read or hear.  Changes in your behavior or personality. When this happens, you may: ? Lose interest in the things that you used to enjoy. ? Withdraw from social situations. ? Get angry more easily than usual. ? Act before thinking. ? Do things in public that you would not usually do. How is this diagnosed? This condition is diagnosed based on:  Your symptoms. Your health care provider may ask you as well as people you spend time with, such as family and friends, about your symptoms. Questions may include: ? How often symptoms occur. ? How long they have been occurring. ? Whether they are getting worse. ? The effect they are having on your life.  Evaluation of mental functions (neuropsychological testing). Your health care provider may refer you to a neurologist or mental health specialist for a detailed evaluation of your mental functions. To identify the cause of your mild neurocognitive disorder, your health care provider may:  Get a detailed medical history.  Ask about alcohol and drug use, including prescription medicines.  Perform a physical exam.  Order blood tests and brain imaging exams. How is this treated? If mild neurocognitive disorder is caused by medicine, drug use, infection, or another medical condition, it may improve when the cause is treated, or when medicines or drugs are stopped. Mild neurocognitive disorder resulting from other causes generally does not improve and may worsen. In these cases, the goal of  treatment is to help you cope with the loss of mental function. Treatments in these cases include:  Medicine. Medicine helps mainly with memory loss and behavioral symptoms.  Talk therapy. Talk therapy provides education, emotional support, memory aids, and other ways of making up for impairments in mental function.  Lifestyle changes, including: ? Regular exercise. ? A healthy diet that includes omega-3 fatty acids. ? Intellectual stimulation. ? Increased social interaction. Follow these instructions at home: Lifestyle   Exercise regularly as told by your health care provider.  Do not use any products that contain nicotine or tobacco, such as cigarettes and e-cigarettes. If you need help quitting, ask your health care provider.  Practice stress-management techniques when you get stressed. If you need help managing stress, ask your health care provider.  Stay social.  Keep your mind active with stimulating activities you enjoy, such as reading or playing games.  Make sure to get quality sleep. These tips can help you to get a good night's rest: ? Avoid napping during the day. ? Keep your sleeping area dark and cool. ? Avoid exercising during the few hours before you go to  bed. ? Avoid caffeine products in the evening. Eating and drinking  Drink enough fluid to keep your urine clear or pale yellow.  Eat a healthy diet that includes omega-3 fatty acids. These can be found in: ? Fish. ? Nuts. ? Leafy vegetables. ? Vegetable oils. General instructions  Take over-the-counter and prescription medicines only as told by your health care provider. Your health care provider may recommend that you avoid taking medicines that can affect thinking, such as pain or sleeping medicines.  Work with your health care provider to determine what you need help with and what your safety needs are.  Keep all follow-up visits as told by your health care provider. This is important. Contact a  health care provider if:  You have any new symptoms. Get help right away if:  You develop new or worsening confusion.  You have behavioral outbursts that place you or your family in danger. Summary  Mild neurocognitive disorder is a disorder in which memory does not work as well as it should. For the most part, this condition does not interfere with a person's daily activities or ability to live independently.  Mild neurocognitive disorder can have many causes and may be the first stage of Alzheimer disease or other types of dementia.  Exercise, healthy diet, getting quality sleep, and keeping your mind active are very important for brain health. This information is not intended to replace advice given to you by your health care provider. Make sure you discuss any questions you have with your health care provider. Document Released: 02/12/2013 Document Revised: 05/25/2017 Document Reviewed: 08/16/2016 Elsevier Patient Education  2020 [employer].

## 2019-02-04 NOTE — Telephone Encounter (Signed)
Left vm for patients wife Eugene Hill that the fish oil rx will be a over the counter for pt to take.He can buy without a rx.

## 2019-02-04 NOTE — Telephone Encounter (Signed)
I called Eugene Hill that pt can purchase the fish oil over the counter. I stated it is OTC medication for pt. She verbalized understanding.

## 2019-02-04 NOTE — Telephone Encounter (Signed)
[email or web address] is asking for a call from RN re:the Rx for      Omega-3 Fatty Acids (FISH OIL) 1000 MG CAPS

## 2019-02-05 ENCOUNTER — Telehealth: Payer: Self-pay | Admitting: Neurology

## 2019-02-05 LAB — COMPREHENSIVE METABOLIC PANEL
ALT: 27 IU/L (ref 0–44)
AST: 17 IU/L (ref 0–40)
Albumin/Globulin Ratio: 1.8 (ref 1.2–2.2)
Albumin: 4.8 g/dL (ref 3.8–4.9)
Alkaline Phosphatase: 64 IU/L (ref 39–117)
BUN/Creatinine Ratio: 15 (ref 9–20)
BUN: 17 mg/dL (ref 6–24)
Bilirubin Total: 0.4 mg/dL (ref 0.0–1.2)
CO2: 22 mmol/L (ref 20–29)
Calcium: 9.3 mg/dL (ref 8.7–10.2)
Chloride: 104 mmol/L (ref 96–106)
Creatinine, Ser: 1.1 mg/dL (ref 0.76–1.27)
GFR calc Af Amer: 87 mL/min/{1.73_m2} (ref >59)
GFR calc non Af Amer: 75 mL/min/{1.73_m2} (ref >59)
Globulin, Total: 2.6 g/dL (ref 1.5–4.5)
Glucose: 117 mg/dL — ABNORMAL HIGH (ref 65–99)
Potassium: 4.3 mmol/L (ref 3.5–5.2)
Sodium: 140 mmol/L (ref 134–144)
Total Protein: 7.4 g/dL (ref 6.0–8.5)

## 2019-02-05 LAB — CBC
Hematocrit: 45.1 % (ref 37.5–51.0)
Hemoglobin: 15.7 g/dL (ref 13.0–17.7)
MCH: 30.5 pg (ref 26.6–33.0)
MCHC: 34.8 g/dL (ref 31.5–35.7)
MCV: 88 fL (ref 79–97)
Platelets: 212 x10E3/uL (ref 150–450)
RBC: 5.15 x10E6/uL (ref 4.14–5.80)
RDW: 12.7 % (ref 11.6–15.4)
WBC: 6.4 x10E3/uL (ref 3.4–10.8)

## 2019-02-05 LAB — DEMENTIA PANEL
Homocysteine: 10.7 umol/L (ref 0.0–14.5)
RPR Ser Ql: NONREACTIVE
TSH: 2.86 u[IU]/mL (ref 0.450–4.500)
Vitamin B-12: 449 pg/mL (ref 232–1245)

## 2019-02-05 NOTE — Telephone Encounter (Signed)
LVM for pt to call back about scheduling mri  [employer] auth: [employer] via [employer] website 

## 2019-02-05 NOTE — Telephone Encounter (Signed)
Pt returned call , stated he works 3rd you may go ahead and schedule in an am slot after 8am , he states the earlier the better

## 2019-02-06 NOTE — Telephone Encounter (Signed)
I spoke to the patient he is scheduled for 01/25/19 at [facility]. No to the covid questions.

## 2019-02-10 ENCOUNTER — Telehealth: Payer: Self-pay

## 2019-02-10 NOTE — Telephone Encounter (Signed)
-----   Message from [name], MD sent at 02/09/2019  1:30 PM EDT ----- Eugene Hill inform the patient that blood chemistries, white count and lab work for reversible causes of memory loss were all normal

## 2019-02-10 NOTE — Telephone Encounter (Signed)
I called pt that his blood chemistries, white count, and lab work memory labs were all normal.Pt verbalized understanding.  ------

## 2019-02-11 ENCOUNTER — Ambulatory Visit (INDEPENDENT_AMBULATORY_CARE_PROVIDER_SITE_OTHER): Payer: 59

## 2019-02-11 ENCOUNTER — Other Ambulatory Visit: Payer: Self-pay

## 2019-02-11 DIAGNOSIS — R413 Other amnesia: Secondary | ICD-10-CM

## 2019-02-11 MED ORDER — GADOBENATE DIMEGLUMINE 529 MG/ML IV SOLN
20.0000 mL | Freq: Once | INTRAVENOUS | Status: AC | PRN
  Administered 2019-02-11: 20 mL via INTRAVENOUS

## 2019-02-19 ENCOUNTER — Telehealth: Payer: Self-pay

## 2019-02-19 ENCOUNTER — Ambulatory Visit: Payer: 59 | Admitting: Neurology

## 2019-02-19 ENCOUNTER — Other Ambulatory Visit: Payer: Self-pay

## 2019-02-19 DIAGNOSIS — R410 Disorientation, unspecified: Secondary | ICD-10-CM

## 2019-02-19 DIAGNOSIS — G3184 Mild cognitive impairment, so stated: Secondary | ICD-10-CM

## 2019-02-19 DIAGNOSIS — R413 Other amnesia: Secondary | ICD-10-CM

## 2019-02-19 NOTE — Telephone Encounter (Signed)
-----   Message from [name], MD sent at 02/13/2019  4:05 PM EDT ----- Kindly inform the patient that MRI scan of the brain shows mild age-appropriate changes of hardening of the arteries.  No worrisome finding.

## 2019-02-19 NOTE — Telephone Encounter (Signed)
Left vm for patient to call back about MRi results.

## 2019-02-20 NOTE — Telephone Encounter (Signed)
Pt has called RN Katrina back re: results, he states between 9:30 and 10:00 he takes a break and would be available for a call if she is available.

## 2019-02-20 NOTE — Telephone Encounter (Signed)
I called pt that the MRI brain results shows mild age appropriate changes of hardening of the arteries . No worrisome findings. PT verbalized understanding.  ------

## 2019-02-20 NOTE — Telephone Encounter (Signed)
Patient has returned call and states that he will be available for the next 30 minutes or he can call back at 8. 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 Please call and advise.

## 2019-02-24 ENCOUNTER — Telehealth: Payer: Self-pay | Admitting: *Deleted

## 2019-02-24 NOTE — Telephone Encounter (Signed)
I spoke to the patient and informed him of the EEG results.  He verbalized understanding.

## 2019-02-24 NOTE — Telephone Encounter (Signed)
-----   Message from [name], MD sent at 02/21/2019  5:51 PM EDT ----- Eugene Hill inform patient that EEG study was normal

## 2019-04-14 ENCOUNTER — Encounter: Payer: Self-pay | Admitting: Neurology

## 2019-04-14 ENCOUNTER — Other Ambulatory Visit: Payer: Self-pay

## 2019-04-14 ENCOUNTER — Ambulatory Visit: Payer: 59 | Admitting: Neurology

## 2019-04-14 VITALS — BP 138/85 | HR 76 | Temp 97.6°F | Wt 233.0 lb

## 2019-04-14 DIAGNOSIS — G3184 Mild cognitive impairment, so stated: Secondary | ICD-10-CM

## 2019-04-14 MED ORDER — CEREFOLIN 6-1-50-5 MG PO TABS: 1.0000 | ORAL_TABLET | ORAL | 3 refills | Status: AC

## 2019-04-14 NOTE — Progress Notes (Signed)
[facility] [address] [place]. [place] [address] [phone]       OFFICE FOLLOW-UP VISIT NOTE  [name] Date of Birth:  May 06, 1961 Medical Record Number:  [id number]   Referring MD: Wende Neighbors Reason for Referral: Memory loss  HPI: Initial consult 02/04/2019 Eugene Hill is a pleasant 58 year old Caucasian male seen today for initial office consultation visit for memory loss.  Is accompanied by his wife.  History is obtained from them as well as review of referral notes.  Patient states he is noticed some short-term memory difficulties for the last 5 to 7 months.  He has been noticing that he is finding a hard time to recall work order at work.  This is usually 6 digits and he has trouble remembering the last few digits.  He has some good days and bad days and can function fairly well on most days but there are clearly days when he has trouble even staying focused at work and difficulty managing his task.  He has trouble remembering new names.  His wife often has to remind him about certain tasks and events that he supposed to do.  Patient has at times also misplaces keys as well as has trouble balancing his checkbook's.  He still independent and doing all his activities including working and driving.  He denies any headaches, dizziness, loss of vision, gait or balance difficulties or weakness.  He denies any history of strokes TIAs seizures significant head injury with loss of consciousness.  1 paternal uncle had dementia.  He has a not had any recent lab work or brain imaging studies done.  He has not been taking any memory enhancement supplements or medications.  He denies feeling depressed or anxious. Update 04/14/2019 : He returns for follow-up after last visit 2 months ago.  He states he is doing well.  He has been doing cognitively challenging activities like playing sudoku every day.  Is also being memory compensation strategies.  He states he has not started  taking Cerefolin yet but does take fish oil every day.  He had lab work on 02/04/2019 which showed normal vitamin B12, TSH, homocystine levels.  RPR was negative.  CBC and complete metabolic panel labs were unremarkable.  MRI scan of the brain done on 02/12/2019 personally reviewed shows only few nonspecific white matter hyperintensities no other acute abnormality.  EEG done on 02/20/2019 was normal.  Patient has no new complaints or health issues today. ROS:   14 system review of systems is positive for memory difficulties, forgetfulness, misplacing objects and all other systems negative  PMH:  Past Medical History:  Diagnosis Date   Arthritis    Hypertension     Social History:  Social History   Socioeconomic History   Marital status: Divorced    Spouse name: Not on file   Number of children: Not on file   Years of education: Not on file   Highest education level: Not on file  Occupational History   Not on file  Social Needs   Financial resource strain: Not on file   Food insecurity    Worry: Not on file    Inability: Not on file   Transportation needs    Medical: Not on file    Non-medical: Not on file  Tobacco Use   Smoking status: Never Smoker   Smokeless tobacco: Current User    Types: Chew  Substance and Sexual Activity   Alcohol use: Yes  Alcohol/week: 6.0 standard drinks    Types: 6 Cans of beer per week   Drug use: No   Sexual activity: Yes    Birth control/protection: None  Lifestyle   Physical activity    Days per week: Not on file    Minutes per session: Not on file   Stress: Not on file  Relationships   Social connections    Talks on phone: Not on file    Gets together: Not on file    Attends religious service: Not on file    Active member of club or organization: Not on file    Attends meetings of clubs or organizations: Not on file    Relationship status: Not on file   Intimate partner violence    Fear of current or ex partner:  Not on file    Emotionally abused: Not on file    Physically abused: Not on file    Forced sexual activity: Not on file  Other Topics Concern   Not on file  Social History Narrative   Not on file    Medications:   Current Outpatient Medications on File Prior to Visit  Medication Sig Dispense Refill   Multiple Vitamin (MULTIVITAMIN) tablet Take 1 tablet by mouth daily.     olmesartan-hydrochlorothiazide (BENICAR HCT) 20-12.5 MG tablet Take 1 tablet by mouth as needed.     Omega-3 Fatty Acids (FISH OIL PO) Take 1,000 mg by mouth.     Turmeric (QC TUMERIC COMPLEX) 500 MG CAPS Take by mouth.     No current facility-administered medications on file prior to visit.     Allergies:  No Known Allergies  Physical Exam General: Mildly obese pleasant middle-aged Caucasian male, seated, in no evident distress Head: head normocephalic and atraumatic.   Neck: supple with no carotid or supraclavicular bruits Cardiovascular: regular rate and rhythm, no murmurs Musculoskeletal: no deformity Skin:  no rash/petichiae Vascular:  Normal pulses all extremities  Neurologic Exam Mental Status: Awake and fully alert. Oriented to place and time. Recent and remote memory intact. Attention span, concentration and fund of knowledge appropriate. Mood and affect appropriate.  Mini-Mental status exam not done today.  Able to name 15 animals which walk on 4 legs.  Clock drawing 4/4.  He has  Recall 3/3.cranial Nerves: Fundoscopic exam reveals sharp disc margins. Pupils equal, briskly reactive to light. Extraocular movements full without nystagmus. Visual fields full to confrontation. Hearing intact. Facial sensation intact. Face, tongue, palate moves normally and symmetrically.  Motor: Normal bulk and tone. Normal strength in all tested extremity muscles. Sensory.: intact to touch , pinprick , position and vibratory sensation.  Coordination: Rapid alternating movements normal in all extremities.  Finger-to-nose and heel-to-shin performed accurately bilaterally. Gait and Station: Arises from chair without difficulty. Stance is normal. Gait demonstrates normal stride length and balance . Able to heel, toe and tandem walk without difficulty.  Reflexes: 1+ and symmetric. Toes downgoing.       ASSESSMENT: 58 year old Caucasian male with subacute symptoms of mild short-term memory difficulties possibly from mild cognitive impairment.     PLAN: I had a long discussion with the patient and his wife regarding his memory loss and mild cognitive impairment which appear to be stable.  I recommend he continue taking fish oil as well as increase participation in cognitively challenging activities like solving crossword puzzles, playing bridge and sudoku.  Also start Cerefolin NAC 1 tablet daily to help cognitive impairment.  We also discussed memory compensation strategies.  He will return for follow-up in the future in 6 months or call earlier if necessary.testing.  Greater than 50% time during this 25-minute   visit was spent on counseling and coordination of care about his memory loss and mild cognitive impairment and answering questions.  He will return for follow-up in 2 months or call earlier if necessary. Antony Contras, MD  [facility] [address] [place], [place] [address]  Phone [phone] Fax [phone] Note: This document was prepared with digital dictation and possible smart phrase technology. Any transcriptional errors that result from this process are unintentional.

## 2019-04-14 NOTE — Patient Instructions (Addendum)
I had a long discussion with the patient and his wife regarding his memory loss and mild cognitive impairment which appear to be stable.  I recommend he continue taking fish oil as well as increase participation in cognitively challenging activities like solving crossword puzzles, playing bridge and sudoku.  Also start Cerefolin NAC 1 tablet daily to help cognitive impairment.  We also discussed memory compensation strategies.  He will return for follow-up in the future in 6 months or call earlier if necessary. Memory Compensation Strategies  1. Use "WARM" strategy.  W= write it down  A= associate it  R= repeat it  M= make a mental note  2.   You can keep a Social worker.  Use a 3-ring notebook with sections for the following: calendar, important names and phone numbers,  medications, doctors' names/phone numbers, lists/reminders, and a section to journal what you did  each day.   3.    Use a calendar to write appointments down.  4.    Write yourself a schedule for the day.  This can be placed on the calendar or in a separate section of the Memory Notebook.  Keeping a  regular schedule can help memory.  5.    Use medication organizer with sections for each day or morning/evening pills.  You may need help loading it  6.    Keep a basket, or pegboard by the door.  Place items that you need to take out with you in the basket or on the pegboard.  You may also want to  include a message board for reminders.  7.    Use sticky notes.  Place sticky notes with reminders in a place where the task is performed.  For example: " turn off the  stove" placed by the stove, "lock the door" placed on the door at eye level, " take your medications" on  the bathroom mirror or by the place where you normally take your medications.  8.    Use alarms/timers.  Use while cooking to remind yourself to check on food or as a reminder to take your medicine, or as a  reminder to make a call, or as a reminder to  perform another task, etc.

## 2019-10-13 ENCOUNTER — Ambulatory Visit: Payer: 59 | Admitting: Neurology

## 2019-11-10 ENCOUNTER — Ambulatory Visit: Payer: 59 | Admitting: Neurology

## 2019-11-26 ENCOUNTER — Ambulatory Visit: Payer: 59 | Admitting: Neurology

## 2020-01-08 ENCOUNTER — Ambulatory Visit: Payer: 59 | Admitting: Neurology

## 2020-01-08 ENCOUNTER — Encounter: Payer: Self-pay | Admitting: Neurology

## 2020-05-27 ENCOUNTER — Ambulatory Visit: Payer: 59 | Admitting: Orthopedic Surgery

## 2020-05-27 ENCOUNTER — Other Ambulatory Visit: Payer: Self-pay

## 2020-05-27 ENCOUNTER — Ambulatory Visit: Payer: 59

## 2020-05-27 ENCOUNTER — Encounter: Payer: Self-pay | Admitting: Orthopedic Surgery

## 2020-05-27 VITALS — BP 168/105 | HR 88 | Ht 71.0 in | Wt 246.0 lb

## 2020-05-27 DIAGNOSIS — M1711 Unilateral primary osteoarthritis, right knee: Secondary | ICD-10-CM

## 2020-05-27 DIAGNOSIS — M25561 Pain in right knee: Secondary | ICD-10-CM

## 2020-05-27 DIAGNOSIS — G8929 Other chronic pain: Secondary | ICD-10-CM | POA: Diagnosis not present

## 2020-05-27 NOTE — Patient Instructions (Addendum)
Take the diclofenac as needed Keep your weight down   You have received an injection of steroids into the joint. 15% of patients will have increased pain within the 24 hours postinjection.   This is transient and will go away.   We recommend that you use ice packs on the injection site for 20 minutes every 2 hours and extra strength Tylenol 2 tablets every 8 as needed until the pain resolves.  If you continue to have pain after taking the Tylenol and using the ice please call the office for further instructions.  Osteoarthritis  Osteoarthritis is a type of arthritis that affects tissue that covers the ends of bones in joints (cartilage). Cartilage acts as a cushion between the bones and helps them move smoothly. Osteoarthritis results when cartilage in the joints gets worn down. Osteoarthritis is sometimes called "wear and tear" arthritis. Osteoarthritis is the most common form of arthritis. It often occurs in older people. It is a condition that gets worse over time (a progressive condition). Joints that are most often affected by this condition are in:  Fingers.  Toes.  Hips.  Knees.  Spine, including neck and lower back. What are the causes? This condition is caused by age-related wearing down of cartilage that covers the ends of bones. What increases the risk? The following factors may make you more likely to develop this condition:  Older age.  Being overweight or obese.  Overuse of joints, such as in athletes.  Past injury of a joint.  Past surgery on a joint.  Family history of osteoarthritis. What are the signs or symptoms? The main symptoms of this condition are pain, swelling, and stiffness in the joint. The joint may lose its shape over time. Small pieces of bone or cartilage may break off and float inside of the joint, which may cause more pain and damage to the joint. Small deposits of bone (osteophytes) may grow on the edges of the joint. Other symptoms may  include:  A grating or scraping feeling inside the joint when you move it.  Popping or creaking sounds when you move. Symptoms may affect one or more joints. Osteoarthritis in a major joint, such as your knee or hip, can make it painful to walk or exercise. If you have osteoarthritis in your hands, you might not be able to grip items, twist your hand, or control small movements of your hands and fingers (fine motor skills). How is this diagnosed? This condition may be diagnosed based on:  Your medical history.  A physical exam.  Your symptoms.  X-rays of the affected joint(s).  Blood tests to rule out other types of arthritis. How is this treated? There is no cure for this condition, but treatment can help to control pain and improve joint function. Treatment plans may include:  A prescribed exercise program that allows for rest and joint relief. You may work with a physical therapist.  A weight control plan.  Pain relief techniques, such as: ? Applying heat and cold to the joint. ? Electric pulses delivered to nerve endings under the skin (transcutaneous electrical nerve stimulation, or TENS). ? Massage. ? Certain nutritional supplements.  NSAIDs or prescription medicines to help relieve pain.  Medicine to help relieve pain and inflammation (corticosteroids). This can be given by mouth (orally) or as an injection.  Assistive devices, such as a brace, wrap, splint, specialized glove, or cane.  Surgery, such as: ? An osteotomy. This is done to reposition the bones and relieve  pain or to remove loose pieces of bone and cartilage. ? Joint replacement surgery. You may need this surgery if you have very bad (advanced) osteoarthritis. Follow these instructions at home: Activity  Rest your affected joints as directed by your health care provider.  Do not drive or use heavy machinery while taking prescription pain medicine.  Exercise as directed. Your health care provider or  physical therapist may recommend specific types of exercise, such as: ? Strengthening exercises. These are done to strengthen the muscles that support joints that are affected by arthritis. They can be performed with weights or with exercise bands to add resistance. ? Aerobic activities. These are exercises, such as brisk walking or water aerobics, that get your heart pumping. ? Range-of-motion activities. These keep your joints easy to move. ? Balance and agility exercises. Managing pain, stiffness, and swelling      If directed, apply heat to the affected area as often as told by your health care provider. Use the heat source that your health care provider recommends, such as a moist heat pack or a heating pad. ? If you have a removable assistive device, remove it as told by your health care provider. ? Place a towel between your skin and the heat source. If your health care provider tells you to keep the assistive device on while you apply heat, place a towel between the assistive device and the heat source. ? Leave the heat on for 20-30 minutes. ? Remove the heat if your skin turns bright red. This is especially important if you are unable to feel pain, heat, or cold. You may have a greater risk of getting burned.  If directed, put ice on the affected joint: ? If you have a removable assistive device, remove it as told by your health care provider. ? Put ice in a plastic bag. ? Place a towel between your skin and the bag. If your health care provider tells you to keep the assistive device on during icing, place a towel between the assistive device and the bag. ? Leave the ice on for 20 minutes, 2-3 times a day. General instructions  Take over-the-counter and prescription medicines only as told by your health care provider.  Maintain a healthy weight. Follow instructions from your health care provider for weight control. These may include dietary restrictions.  Do not use any products  that contain nicotine or tobacco, such as cigarettes and e-cigarettes. These can delay bone healing. If you need help quitting, ask your health care provider.  Use assistive devices as directed by your health care provider.  Keep all follow-up visits as told by your health care provider. This is important. Where to find more information  [employer] of Arthritis and Musculoskeletal and Skin Diseases: www.niams.SouthExposed.es  [employer] on Aging: http://kim-miller.com/  American College of Rheumatology: www.rheumatology.org Contact a health care provider if:  Your skin turns red.  You develop a rash.  You have pain that gets worse.  You have a fever along with joint or muscle aches. Get help right away if:  You lose a lot of weight.  You suddenly lose your appetite.  You have night sweats. Summary  Osteoarthritis is a type of arthritis that affects tissue covering the ends of bones in joints (cartilage).  This condition is caused by age-related wearing down of cartilage that covers the ends of bones.  The main symptom of this condition is pain, swelling, and stiffness in the joint.  There is no cure  for this condition, but treatment can help to control pain and improve joint function. This information is not intended to replace advice given to you by your health care provider. Make sure you discuss any questions you have with your health care provider. Document Revised: 05/25/2017 Document Reviewed: 02/14/2016 Elsevier Patient Education  2020 [employer].

## 2020-05-27 NOTE — Progress Notes (Signed)
Patient ID: Eugene Hill, male   DOB: [birth year], 59 y.o.   MRN: [id number]  Chief Complaint  Patient presents with  . Knee Pain    right     59 year old male had a knee arthroscopy back in 2016 presents with recurrent pain right knee.  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 complains of medial joint pain intermittently worse at times he is already on diclofenac seems to have increased pain when he twists or turns he notices his loss of motion in the knee especially bending has trouble kneeling and squatting as well   Review of Systems  Respiratory: Negative for shortness of breath.   Cardiovascular: Negative for chest pain.    Past Medical History:  Diagnosis Date  . Arthritis   . Hypertension     Past Surgical History:  Procedure Laterality Date  . COLONOSCOPY N/A 08/15/2013   Procedure: COLONOSCOPY;  Surgeon: Danie Binder, MD;  Location: [facility];  Service: Endoscopy;  Laterality: N/A;  9:15 AM  . KNEE ARTHROSCOPY WITH MEDIAL MENISECTOMY Right 07/31/2014   Procedure: KNEE ARTHROSCOPY WITH MEDIAL AND LATERAL MENISECTOMY;  Surgeon: Carole Civil, MD;  Location: [facility];  Service: Orthopedics;  Laterality: Right;  . Left broken arm    . NECK SURGERY     cervical c4- c5  . Right knee arthroscopy  1987    PHYSICAL EXAM  BP (!) 168/105   Pulse 88   Ht 5\' 11"  (1.803 m)   Wt 246 lb (111.6  kg)   BMI 34.31 kg/m  GENERAL appearance reveals no gross abnormalities, normal development grooming and hygiene   MENTAL STATUS we note that the patient is awake alert and oriented to person place and time MOOD/AFFECT ARE NORMAL   GAIT reveals no limp in the effected limb  Ortho Exam VASC 2+ dorsalis pedis pulse normal capillary refill excellent warmth to the extremity  NEURO normal sensation and no pathologic reflexes  LYMPH deferred noncontributory  MEDICAL DECISION MAKING  A.  Encounter Diagnoses  Name Primary?  . Chronic pain of right knee Yes  . Primary osteoarthritis of right knee     B. DATA ANALYSED: IMAGING: Independent interpretation of images: Mild varus moderate degenerative changes  Especially medial compartment  Orders:   Outside records reviewed: Dr. Juel Burrow notes were reviewed C. MANAGEMENT   Counseling on arthritis weight loss Continue Mobic as needed Inject knee Consider hyaluronic acid if no improvement  Procedure note right knee injection   verbal consent was obtained to inject right knee joint  Timeout was completed to confirm the site of injection  The medications used were 40 mg of Depo-Medrol and 1% lidocaine 3 cc  Anesthesia was provided by ethyl chloride and the skin was prepped with alcohol.  After cleaning the skin with alcohol a 20-gauge needle was used to inject the right knee joint. There were no complications. A sterile bandage was applied.  No orders of  the defined types were placed in this encounter.

## 2021-04-28 ENCOUNTER — Other Ambulatory Visit: Payer: Self-pay

## 2021-04-28 ENCOUNTER — Ambulatory Visit: Payer: 59 | Admitting: Orthopedic Surgery

## 2021-04-28 ENCOUNTER — Encounter: Payer: Self-pay | Admitting: Orthopedic Surgery

## 2021-04-28 ENCOUNTER — Ambulatory Visit: Payer: 59

## 2021-04-28 VITALS — BP 165/102 | HR 65 | Ht 70.0 in | Wt 245.0 lb

## 2021-04-28 DIAGNOSIS — G8929 Other chronic pain: Secondary | ICD-10-CM | POA: Diagnosis not present

## 2021-04-28 DIAGNOSIS — M1711 Unilateral primary osteoarthritis, right knee: Secondary | ICD-10-CM | POA: Diagnosis not present

## 2021-04-28 DIAGNOSIS — M7541 Impingement syndrome of right shoulder: Secondary | ICD-10-CM

## 2021-04-28 DIAGNOSIS — M25511 Pain in right shoulder: Secondary | ICD-10-CM

## 2021-04-28 NOTE — Progress Notes (Signed)
EVALUATION AND MANAGEMENT   Type of appointment : new problem   PLAN: I ADVISED INJECTION AND HE AGREED   No orders of the defined types were placed in this encounter.    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  Pain seems to start at 100 of flexion and 90 of abduction    Review of Systems  Constitutional:  Negative for fever.  Skin:  Negative for rash.  Neurological:  Negative for tingling and focal weakness.    Body mass index is 35.15 kg/m.  Physical Exam Constitutional:      General: He is not in acute distress.    Appearance: He is well-developed.     Comments: Well developed, well nourished Normal grooming and hygiene     Cardiovascular:     Comments: No peripheral edema Musculoskeletal:     Right shoulder: Tenderness and crepitus present. No swelling, deformity, effusion or laceration. Normal strength. Normal pulse.     Comments: Rom is painful at 90 abd and 100 flexion   Arom and prom is normal   Neer + at 160 and hawkins +   Abd er no instability   Skin:    General: Skin is warm and dry.  Neurological:     Mental Status: He is alert and oriented to person, place, and time.     Sensory: No sensory deficit.     Coordination: Coordination normal.     Gait: Gait normal.     Deep Tendon Reflexes: Reflexes are normal and symmetric.  Psychiatric:        Mood and Affect: Mood normal.        Behavior: Behavior normal.        Thought Content: Thought content normal.        Judgment: Judgment normal.     Comments: Affect normal     Past Medical History:  Diagnosis Date   Arthritis    Hypertension    Past Surgical History:  Procedure Laterality Date   COLONOSCOPY N/A 08/15/2013   Procedure: COLONOSCOPY;  Surgeon: Danie Binder, MD;  [facility];  Service: Endoscopy;  Laterality: N/A;   9:15 AM   KNEE ARTHROSCOPY WITH MEDIAL MENISECTOMY Right 07/31/2014   Procedure: KNEE ARTHROSCOPY WITH MEDIAL AND LATERAL MENISECTOMY;  Surgeon: Carole Civil, MD;  Location: [facility];  Service: Orthopedics;  Laterality: Right;   Left broken arm     NECK SURGERY     cervical c4- c5   Right knee arthroscopy  1987   Social History   Tobacco Use   Smoking status: Never   Smokeless tobacco: Current    Types: Chew  Substance Use Topics   Alcohol use: Yes    Alcohol/week: 6.0 standard drinks    Types: 6 Cans of beer per week   Drug use: No     Assessment and Plan:  IMAGING NO AC CHANGES , SUBTLE RC DX   Encounter Diagnoses  Name Primary?   Chronic right shoulder pain Yes   Acute pain of right shoulder    Impingement syndrome of right shoulder      Procedure note the subacromial injection shoulder RIGHT    Verbal consent was obtained to inject the  RIGHT   Shoulder  Timeout was completed to confirm the  injection site is a subacromial space of the  RIGHT  shoulder   Medication used Depo-Medrol 40 mg and lidocaine 1% 3 cc  Anesthesia was provided by ethyl chloride  The injection was performed in the RIGHT  posterior subacromial space. After pinning the skin with alcohol and anesthetized the skin with ethyl chloride the subacromial space was injected using a 20-gauge needle. There were no complications  Sterile dressing was applied.

## 2021-05-02 ENCOUNTER — Ambulatory Visit: Payer: 59 | Admitting: Orthopedic Surgery

## 2021-05-02 ENCOUNTER — Other Ambulatory Visit: Payer: Self-pay

## 2021-05-02 ENCOUNTER — Encounter: Payer: Self-pay | Admitting: Orthopedic Surgery

## 2021-05-02 DIAGNOSIS — M1711 Unilateral primary osteoarthritis, right knee: Secondary | ICD-10-CM

## 2021-05-02 DIAGNOSIS — G8929 Other chronic pain: Secondary | ICD-10-CM

## 2021-05-02 NOTE — Progress Notes (Signed)
Chief Complaint  Patient presents with   Knee Pain    RIGHT Euflexxa #1    Encounter Diagnoses  Name Primary?   Primary osteoarthritis of right knee Yes   Chronic pain of right knee     First of 3 Euflexxa injections right knee  Patient has osteoarthritis of the right knee he failed nonoperative treatment he has chronic pain in that knee  Procedure note  Hyaluronic acid acid injection in the form of Euflexxa  Right knee placed at 90 degrees of flexion lateral portal approach knee cleaned with alcohol sprayed with ethyl chloride medication injected without complication  Follow-up 1 week for second injection

## 2021-05-09 ENCOUNTER — Other Ambulatory Visit: Payer: Self-pay

## 2021-05-09 ENCOUNTER — Ambulatory Visit: Payer: 59 | Admitting: Orthopedic Surgery

## 2021-05-09 DIAGNOSIS — M1711 Unilateral primary osteoarthritis, right knee: Secondary | ICD-10-CM

## 2021-05-09 DIAGNOSIS — M25561 Pain in right knee: Secondary | ICD-10-CM

## 2021-05-09 DIAGNOSIS — G8929 Other chronic pain: Secondary | ICD-10-CM

## 2021-05-09 NOTE — Progress Notes (Signed)
Chief Complaint  Patient presents with   Knee Pain    RT   Injections    RT knee Euflexxa #2    Encounter Diagnoses  Name Primary?   Primary osteoarthritis of right knee Yes   Chronic pain of right knee     Second of 3 Euflexxa injections right knee  Good result no complications noted from the first injection  Consent was given to inject the right knee again  Site confirmed as right knee  Alcohol and ethyl chloride used to repair the skin 21-gauge needle used to inject from the lateral portal with the knee in flexion no complications  Third injection right knee 1 week

## 2021-05-16 ENCOUNTER — Encounter: Payer: Self-pay | Admitting: Orthopedic Surgery

## 2021-05-16 ENCOUNTER — Other Ambulatory Visit: Payer: Self-pay

## 2021-05-16 ENCOUNTER — Ambulatory Visit: Payer: 59 | Admitting: Orthopedic Surgery

## 2021-05-16 DIAGNOSIS — M1711 Unilateral primary osteoarthritis, right knee: Secondary | ICD-10-CM | POA: Diagnosis not present

## 2021-05-16 DIAGNOSIS — M25561 Pain in right knee: Secondary | ICD-10-CM

## 2021-05-16 DIAGNOSIS — G8929 Other chronic pain: Secondary | ICD-10-CM

## 2021-05-16 NOTE — Progress Notes (Signed)
Chief Complaint  Patient presents with   Injections    3rd Euflexxa Right knee   So far so good:   Consent was given to inject the right knee again   Site confirmed as right knee   Alcohol and ethyl chloride used to repair the skin 21-gauge needle used to inject from the lateral portal with the knee in flexion no complications   F/u 6 weeks by phone

## 2021-06-30 ENCOUNTER — Other Ambulatory Visit: Payer: Self-pay

## 2021-06-30 ENCOUNTER — Encounter: Payer: Self-pay | Admitting: Orthopedic Surgery

## 2021-06-30 ENCOUNTER — Ambulatory Visit: Payer: 59 | Admitting: Orthopedic Surgery

## 2021-06-30 DIAGNOSIS — M1711 Unilateral primary osteoarthritis, right knee: Secondary | ICD-10-CM

## 2021-06-30 MED ORDER — MELOXICAM 15 MG PO TABS: 15.0000 mg | ORAL_TABLET | Freq: Every day | ORAL | 5 refills | Status: AC

## 2021-06-30 NOTE — Progress Notes (Signed)
Chief Complaint  Patient presents with   Knee Pain    Right // feels better     Encounter Diagnosis  Name Primary?   Primary osteoarthritis of right knee Yes    Eugene Hill is a 61 year old male with osteoarthritis of the right knee he had 3 Euflexxa injections in November 2022 he is on meloxicam which he takes 1-2 times a week and he reports that his knee is doing great he has some occasional discomfort when he goes down an incline but otherwise doing well  His right knee has no effusion his gait is normal his range of motion is 130 degrees with full extension  We will continue his meloxicam and see him as needed with repeat Euflexxa in 6 months if needed

## 2022-05-12 ENCOUNTER — Other Ambulatory Visit (HOSPITAL_COMMUNITY): Payer: Self-pay | Admitting: Internal Medicine

## 2022-05-12 DIAGNOSIS — E782 Mixed hyperlipidemia: Secondary | ICD-10-CM

## 2022-05-24 ENCOUNTER — Ambulatory Visit (HOSPITAL_COMMUNITY)
Admission: RE | Admit: 2022-05-24 | Discharge: 2022-05-24 | Disposition: A | Discharge status: Home / Self Care | Payer: 59 | Source: Ambulatory Visit | Attending: Internal Medicine | Admitting: Internal Medicine

## 2022-05-24 DIAGNOSIS — E782 Mixed hyperlipidemia: Secondary | ICD-10-CM | POA: Insufficient documentation

## 2022-07-24 ENCOUNTER — Other Ambulatory Visit (HOSPITAL_COMMUNITY): Payer: Self-pay | Admitting: Internal Medicine

## 2022-07-24 DIAGNOSIS — K4090 Unilateral inguinal hernia, without obstruction or gangrene, not specified as recurrent: Secondary | ICD-10-CM

## 2022-07-27 ENCOUNTER — Ambulatory Visit (HOSPITAL_COMMUNITY)
Admission: RE | Admit: 2022-07-27 | Discharge: 2022-07-27 | Disposition: A | Discharge status: Home / Self Care | Payer: 59 | Source: Ambulatory Visit | Attending: Internal Medicine | Admitting: Internal Medicine

## 2022-07-27 ENCOUNTER — Other Ambulatory Visit (HOSPITAL_COMMUNITY): Payer: Self-pay | Admitting: Internal Medicine

## 2022-07-27 DIAGNOSIS — K4090 Unilateral inguinal hernia, without obstruction or gangrene, not specified as recurrent: Secondary | ICD-10-CM | POA: Diagnosis present

## 2022-08-07 ENCOUNTER — Other Ambulatory Visit: Payer: Self-pay | Admitting: *Deleted

## 2022-08-07 DIAGNOSIS — K4090 Unilateral inguinal hernia, without obstruction or gangrene, not specified as recurrent: Secondary | ICD-10-CM | POA: Insufficient documentation

## 2022-08-10 ENCOUNTER — Ambulatory Visit: Payer: 59 | Admitting: General Surgery

## 2022-08-10 ENCOUNTER — Encounter: Payer: Self-pay | Admitting: General Surgery

## 2022-08-10 ENCOUNTER — Encounter: Payer: Self-pay | Admitting: *Deleted

## 2022-08-10 VITALS — BP 179/105 | HR 71 | Temp 98.2°F | Resp 14 | Ht 70.0 in | Wt 247.0 lb

## 2022-08-10 DIAGNOSIS — K4090 Unilateral inguinal hernia, without obstruction or gangrene, not specified as recurrent: Secondary | ICD-10-CM

## 2022-08-10 NOTE — H&P (Signed)
Eugene Hill; [id number]; 1960-12-25   HPI Patient is a 62 year old white male who was referred to my care by Dr. Nevada Hill for evaluation and treatment of a left inguinal hernia.  He first started noticing pain and swelling in the left groin region last month.  He was seen by his primary care physician and was diagnosed with a left inguinal hernia.  He states that it is causing him discomfort especially at work where he does strenuous activity.  He denies any nausea or vomiting.  He has never had surgery in the left groin region. Past Medical History:  Diagnosis Date   Arthritis    Hypertension     Past Surgical History:  Procedure Laterality Date   COLONOSCOPY N/A 08/15/2013   Procedure: COLONOSCOPY;  Surgeon: Eugene Binder, MD;  Location: [facility];  Service: Endoscopy;  Laterality: N/A;  9:15 AM   KNEE ARTHROSCOPY WITH MEDIAL MENISECTOMY Right 07/31/2014   Procedure: KNEE ARTHROSCOPY WITH MEDIAL AND LATERAL MENISECTOMY;  Surgeon: Eugene Civil, MD;  Location: [facility];  Service: Orthopedics;  Laterality: Right;   Left broken arm     NECK SURGERY     cervical c4- c5   Right knee arthroscopy  1987    Family History  Problem Relation Age of Onset   Dementia Paternal Uncle    Colon cancer Neg Hx     Current Outpatient Medications on File Prior to Visit  Medication Sig Dispense Refill   celecoxib (CELEBREX) 200 MG capsule Take 200 mg by mouth daily.     cholecalciferol (VITAMIN D3) 25 MCG (1000 UNIT) tablet Take 1,000 Units by mouth daily.     cyanocobalamin (VITAMIN B12) 500 MCG tablet Take 500 mcg by mouth daily.     magnesium 30 MG tablet Take 30 mg by mouth 2 (two) times daily.     Multiple Vitamin (MULTIVITAMIN) tablet Take 1 tablet by mouth daily.     No current facility-administered medications on file prior to visit.    No Known Allergies  Social History   Substance and Sexual Activity  Alcohol Use Yes   Alcohol/week: 6.0 standard drinks of alcohol   Types: 6  Cans of beer per week    Social History   Tobacco Use  Smoking Status Never  Smokeless Tobacco Current   Types: Chew    Review of Systems  Constitutional: Negative.   HENT: Negative.    Eyes: Negative.   Respiratory: Negative.    Cardiovascular: Negative.   Gastrointestinal: Negative.   Genitourinary: Negative.   Musculoskeletal:  Positive for joint pain.  Skin: Negative.   Neurological: Negative.   Endo/Heme/Allergies: Negative.   Psychiatric/Behavioral: Negative.      Objective   Vitals:   08/10/22 1031  BP: (!) 179/105  Pulse: 71  Resp: 14  Temp: 98.2 F (36.8 C)  SpO2: 94%    Physical Exam Vitals reviewed.  Constitutional:      Appearance: Normal appearance. He is obese. He is not ill-appearing.  HENT:     Head: Normocephalic and atraumatic.  Cardiovascular:     Rate and Rhythm: Normal rate and regular rhythm.     Heart sounds: Normal heart sounds. No murmur heard.    No friction rub. No gallop.  Pulmonary:     Effort: Pulmonary effort is normal. No respiratory distress.     Breath sounds: Normal breath sounds. No stridor. No wheezing, rhonchi or rales.  Abdominal:     General: Bowel sounds are normal.  There is no distension.     Palpations: Abdomen is soft. There is no mass.     Tenderness: There is no abdominal tenderness. There is no guarding or rebound.     Hernia: A hernia is present.     Comments: Easily reducible left inguinal hernia.  No right inguinal hernia.  Genitourinary:    Testes: Normal.  Skin:    General: Skin is warm and dry.  Neurological:     Mental Status: He is alert and oriented to person, place, and time.    Primary care reviewed Assessment  Left inguinal hernia Plan  Patient is scheduled for robotic assisted left inguinal herniorrhaphy with mesh on 08/25/2022.  The risks and benefits of the procedure including bleeding, infection, mesh use, the possibility of recurrence of the hernia were fully explained to the patient,  who gave informed consent.

## 2022-08-10 NOTE — Progress Notes (Signed)
Eugene Hill; [id number]; [birth year]   HPI Patient is a 62 year old white male who was referred to my care by Dr. Nevada Crane for evaluation and treatment of a left inguinal hernia.  He first started noticing pain and swelling in the left groin region last month.  He was seen by his primary care physician and was diagnosed with a left inguinal hernia.  He states that it is causing him discomfort especially at work where he does strenuous activity.  He denies any nausea or vomiting.  He has never had surgery in the left groin region. Past Medical History:  Diagnosis Date   Arthritis    Hypertension     Past Surgical History:  Procedure Laterality Date   COLONOSCOPY N/A 08/15/2013   Procedure: COLONOSCOPY;  Surgeon: Danie Binder, MD;  Location: [facility];  Service: Endoscopy;  Laterality: N/A;  9:15 AM   KNEE ARTHROSCOPY WITH MEDIAL MENISECTOMY Right 07/31/2014   Procedure: KNEE ARTHROSCOPY WITH MEDIAL AND LATERAL MENISECTOMY;  Surgeon: Carole Civil, MD;  Location: [facility];  Service: Orthopedics;  Laterality: Right;   Left broken arm     NECK SURGERY     cervical c4- c5   Right knee arthroscopy  1987    Family History  Problem Relation Age of Onset   Dementia Paternal Uncle    Colon cancer Neg Hx     Current Outpatient Medications on File Prior to Visit  Medication Sig Dispense Refill   celecoxib (CELEBREX) 200 MG capsule Take 200 mg by mouth daily.     cholecalciferol (VITAMIN D3) 25 MCG (1000 UNIT) tablet Take 1,000 Units by mouth daily.     cyanocobalamin (VITAMIN B12) 500 MCG tablet Take 500 mcg by mouth daily.     magnesium 30 MG tablet Take 30 mg by mouth 2 (two) times daily.     Multiple Vitamin (MULTIVITAMIN) tablet Take 1 tablet by mouth daily.     No current facility-administered medications on file prior to visit.    No Known Allergies  Social History   Substance and Sexual Activity  Alcohol Use Yes   Alcohol/week: 6.0 standard drinks of alcohol   Types: 6  Cans of beer per week    Social History   Tobacco Use  Smoking Status Never  Smokeless Tobacco Current   Types: Chew    Review of Systems  Constitutional: Negative.   HENT: Negative.    Eyes: Negative.   Respiratory: Negative.    Cardiovascular: Negative.   Gastrointestinal: Negative.   Genitourinary: Negative.   Musculoskeletal:  Positive for joint pain.  Skin: Negative.   Neurological: Negative.   Endo/Heme/Allergies: Negative.   Psychiatric/Behavioral: Negative.      Objective   Vitals:   08/10/22 1031  BP: (!) 179/105  Pulse: 71  Resp: 14  Temp: 98.2 F (36.8 C)  SpO2: 94%    Physical Exam Vitals reviewed.  Constitutional:      Appearance: Normal appearance. He is obese. He is not ill-appearing.  HENT:     Head: Normocephalic and atraumatic.  Cardiovascular:     Rate and Rhythm: Normal rate and regular rhythm.     Heart sounds: Normal heart sounds. No murmur heard.    No friction rub. No gallop.  Pulmonary:     Effort: Pulmonary effort is normal. No respiratory distress.     Breath sounds: Normal breath sounds. No stridor. No wheezing, rhonchi or rales.  Abdominal:     General: Bowel sounds are normal.  There is no distension.     Palpations: Abdomen is soft. There is no mass.     Tenderness: There is no abdominal tenderness. There is no guarding or rebound.     Hernia: A hernia is present.     Comments: Easily reducible left inguinal hernia.  No right inguinal hernia.  Genitourinary:    Testes: Normal.  Skin:    General: Skin is warm and dry.  Neurological:     Mental Status: He is alert and oriented to person, place, and time.    Primary care reviewed Assessment  Left inguinal hernia Plan  Patient is scheduled for robotic assisted laparoscopic left inguinal herniorrhaphy with mesh on 08/25/2022.  The risks and benefits of the procedure including bleeding, infection, mesh use, the possibility of recurrence of the hernia were fully explained to  the patient, who gave informed consent.

## 2022-08-23 ENCOUNTER — Encounter (HOSPITAL_COMMUNITY): Payer: Self-pay

## 2022-08-23 ENCOUNTER — Other Ambulatory Visit: Payer: Self-pay

## 2022-08-23 ENCOUNTER — Encounter (HOSPITAL_COMMUNITY)
Admission: RE | Admit: 2022-08-23 | Discharge: 2022-08-23 | Disposition: A | Discharge status: Home / Self Care | Payer: 59 | Source: Ambulatory Visit | Attending: General Surgery | Admitting: General Surgery

## 2022-08-25 ENCOUNTER — Encounter (HOSPITAL_COMMUNITY)
Admission: RE | Disposition: A | Discharge status: Home / Self Care | Payer: Self-pay | Source: Home / Self Care | Attending: General Surgery

## 2022-08-25 ENCOUNTER — Other Ambulatory Visit: Payer: Self-pay

## 2022-08-25 ENCOUNTER — Ambulatory Visit (HOSPITAL_BASED_OUTPATIENT_CLINIC_OR_DEPARTMENT_OTHER): Payer: 59 | Admitting: Anesthesiology

## 2022-08-25 ENCOUNTER — Ambulatory Visit (HOSPITAL_COMMUNITY)
Admission: RE | Admit: 2022-08-25 | Discharge: 2022-08-25 | Disposition: A | Discharge status: Home / Self Care | Payer: 59 | Attending: General Surgery | Admitting: General Surgery

## 2022-08-25 ENCOUNTER — Ambulatory Visit (HOSPITAL_COMMUNITY): Payer: 59 | Admitting: Anesthesiology

## 2022-08-25 ENCOUNTER — Encounter (HOSPITAL_COMMUNITY): Payer: Self-pay | Admitting: General Surgery

## 2022-08-25 DIAGNOSIS — M1990 Unspecified osteoarthritis, unspecified site: Secondary | ICD-10-CM | POA: Insufficient documentation

## 2022-08-25 DIAGNOSIS — K4090 Unilateral inguinal hernia, without obstruction or gangrene, not specified as recurrent: Secondary | ICD-10-CM | POA: Diagnosis present

## 2022-08-25 DIAGNOSIS — I10 Essential (primary) hypertension: Secondary | ICD-10-CM | POA: Diagnosis not present

## 2022-08-25 DIAGNOSIS — Z79899 Other long term (current) drug therapy: Secondary | ICD-10-CM | POA: Insufficient documentation

## 2022-08-25 DIAGNOSIS — F17220 Nicotine dependence, chewing tobacco, uncomplicated: Secondary | ICD-10-CM | POA: Diagnosis not present

## 2022-08-25 HISTORY — PX: XI ROBOTIC ASSISTED INGUINAL HERNIA REPAIR WITH MESH: SHX6706

## 2022-08-25 SURGERY — REPAIR, HERNIA, INGUINAL, ROBOT-ASSISTED, LAPAROSCOPIC, USING MESH
Anesthesia: General | Site: Groin | Laterality: Left

## 2022-08-25 MED ORDER — STERILE WATER FOR IRRIGATION IR SOLN
Status: DC | PRN
  Administered 2022-08-25: 1000 mL

## 2022-08-25 MED ORDER — EPHEDRINE SULFATE-NACL 50-0.9 MG/10ML-% IV SOSY
PREFILLED_SYRINGE | INTRAVENOUS | Status: DC | PRN
  Administered 2022-08-25: 10 mg via INTRAVENOUS

## 2022-08-25 MED ORDER — PHENYLEPHRINE HCL (PRESSORS) 10 MG/ML IV SOLN
INTRAVENOUS | Status: AC
  Filled 2022-08-25: qty 1

## 2022-08-25 MED ORDER — LACTATED RINGERS IV SOLN: INTRAVENOUS | Status: DC

## 2022-08-25 MED ORDER — FENTANYL CITRATE (PF) 250 MCG/5ML IJ SOLN
INTRAMUSCULAR | Status: DC | PRN
  Administered 2022-08-25 (×3): 50 ug via INTRAVENOUS

## 2022-08-25 MED ORDER — DEXMEDETOMIDINE HCL IN NACL 80 MCG/20ML IV SOLN
INTRAVENOUS | Status: AC
  Filled 2022-08-25: qty 20

## 2022-08-25 MED ORDER — LIDOCAINE 2% (20 MG/ML) 5 ML SYRINGE
INTRAMUSCULAR | Status: DC | PRN
  Administered 2022-08-25: 100 mg via INTRAVENOUS

## 2022-08-25 MED ORDER — KETOROLAC TROMETHAMINE 30 MG/ML IJ SOLN
INTRAMUSCULAR | Status: DC | PRN
  Administered 2022-08-25: 30 mg via INTRAVENOUS

## 2022-08-25 MED ORDER — PHENYLEPHRINE 80 MCG/ML (10ML) SYRINGE FOR IV PUSH (FOR BLOOD PRESSURE SUPPORT)
PREFILLED_SYRINGE | INTRAVENOUS | Status: DC | PRN
  Administered 2022-08-25 (×2): 80 ug via INTRAVENOUS
  Administered 2022-08-25: 160 ug via INTRAVENOUS
  Administered 2022-08-25 (×2): 80 ug via INTRAVENOUS

## 2022-08-25 MED ORDER — DEXAMETHASONE SODIUM PHOSPHATE 10 MG/ML IJ SOLN
INTRAMUSCULAR | Status: AC
  Filled 2022-08-25: qty 1

## 2022-08-25 MED ORDER — PROPOFOL 10 MG/ML IV BOLUS
INTRAVENOUS | Status: DC | PRN
  Administered 2022-08-25: 200 mg via INTRAVENOUS

## 2022-08-25 MED ORDER — GLYCOPYRROLATE PF 0.2 MG/ML IJ SOSY
PREFILLED_SYRINGE | INTRAMUSCULAR | Status: AC
  Filled 2022-08-25: qty 1

## 2022-08-25 MED ORDER — ONDANSETRON HCL 4 MG/2ML IJ SOLN: 4.0000 mg | Freq: Once | INTRAMUSCULAR | Status: DC | PRN

## 2022-08-25 MED ORDER — BUPIVACAINE LIPOSOME 1.3 % IJ SUSP
INTRAMUSCULAR | Status: AC
  Filled 2022-08-25: qty 20

## 2022-08-25 MED ORDER — PROPOFOL 10 MG/ML IV BOLUS
INTRAVENOUS | Status: AC
  Filled 2022-08-25: qty 20

## 2022-08-25 MED ORDER — HYDROMORPHONE HCL 1 MG/ML IJ SOLN
INTRAMUSCULAR | Status: AC
  Filled 2022-08-25: qty 0.5

## 2022-08-25 MED ORDER — SUGAMMADEX SODIUM 200 MG/2ML IV SOLN
INTRAVENOUS | Status: DC | PRN
  Administered 2022-08-25: 200 mg via INTRAVENOUS

## 2022-08-25 MED ORDER — CEFAZOLIN SODIUM-DEXTROSE 2-4 GM/100ML-% IV SOLN
2.0000 g | INTRAVENOUS | Status: AC
  Administered 2022-08-25: 2 g via INTRAVENOUS

## 2022-08-25 MED ORDER — ONDANSETRON HCL 4 MG/2ML IJ SOLN
INTRAMUSCULAR | Status: AC
  Filled 2022-08-25: qty 2

## 2022-08-25 MED ORDER — ORAL CARE MOUTH RINSE: 15.0000 mL | Freq: Once | OROMUCOSAL | Status: DC

## 2022-08-25 MED ORDER — DEXMEDETOMIDINE HCL IN NACL 80 MCG/20ML IV SOLN
INTRAVENOUS | Status: DC | PRN
  Administered 2022-08-25: 4 ug via BUCCAL
  Administered 2022-08-25 (×2): 8 ug via BUCCAL

## 2022-08-25 MED ORDER — HYDROCODONE-ACETAMINOPHEN 5-325 MG PO TABS: 1.0000 | ORAL_TABLET | ORAL | 0 refills | Status: DC | PRN

## 2022-08-25 MED ORDER — ONDANSETRON 4 MG PO TBDP
4.0000 mg | ORAL_TABLET | Freq: Once | ORAL | Status: AC
  Administered 2022-08-25: 4 mg via ORAL

## 2022-08-25 MED ORDER — CHLORHEXIDINE GLUCONATE CLOTH 2 % EX PADS: 6.0000 | MEDICATED_PAD | Freq: Once | CUTANEOUS | Status: DC

## 2022-08-25 MED ORDER — EPHEDRINE 5 MG/ML INJ
INTRAVENOUS | Status: AC
  Filled 2022-08-25: qty 5

## 2022-08-25 MED ORDER — DEXAMETHASONE SODIUM PHOSPHATE 4 MG/ML IJ SOLN
INTRAMUSCULAR | Status: DC | PRN
  Administered 2022-08-25: 5 mg via INTRAVENOUS

## 2022-08-25 MED ORDER — BUPIVACAINE LIPOSOME 1.3 % IJ SUSP
INTRAMUSCULAR | Status: DC | PRN
  Administered 2022-08-25: 20 mL

## 2022-08-25 MED ORDER — HYDROMORPHONE HCL 1 MG/ML IJ SOLN
0.2500 mg | INTRAMUSCULAR | Status: DC | PRN
  Administered 2022-08-25: 0.5 mg via INTRAVENOUS

## 2022-08-25 MED ORDER — FENTANYL CITRATE (PF) 250 MCG/5ML IJ SOLN
INTRAMUSCULAR | Status: AC
  Filled 2022-08-25: qty 5

## 2022-08-25 MED ORDER — CEFAZOLIN SODIUM-DEXTROSE 2-4 GM/100ML-% IV SOLN
INTRAVENOUS | Status: AC
  Filled 2022-08-25: qty 100

## 2022-08-25 MED ORDER — MIDAZOLAM HCL 2 MG/2ML IJ SOLN
INTRAMUSCULAR | Status: AC
  Filled 2022-08-25: qty 2

## 2022-08-25 MED ORDER — MIDAZOLAM HCL 2 MG/2ML IJ SOLN
INTRAMUSCULAR | Status: DC | PRN
  Administered 2022-08-25: 2 mg via INTRAVENOUS

## 2022-08-25 MED ORDER — ONDANSETRON 4 MG PO TBDP
ORAL_TABLET | ORAL | Status: AC
  Filled 2022-08-25: qty 1

## 2022-08-25 MED ORDER — ROCURONIUM BROMIDE 10 MG/ML (PF) SYRINGE
PREFILLED_SYRINGE | INTRAVENOUS | Status: AC
  Filled 2022-08-25: qty 10

## 2022-08-25 MED ORDER — CHLORHEXIDINE GLUCONATE 0.12 % MT SOLN: 15.0000 mL | Freq: Once | OROMUCOSAL | Status: DC

## 2022-08-25 MED ORDER — PHENYLEPHRINE HCL-NACL 20-0.9 MG/250ML-% IV SOLN
INTRAVENOUS | Status: DC | PRN
  Administered 2022-08-25: 30 ug/min via INTRAVENOUS

## 2022-08-25 MED ORDER — ROCURONIUM BROMIDE 10 MG/ML (PF) SYRINGE
PREFILLED_SYRINGE | INTRAVENOUS | Status: DC | PRN
  Administered 2022-08-25: 60 mg via INTRAVENOUS
  Administered 2022-08-25 (×2): 20 mg via INTRAVENOUS

## 2022-08-25 MED ORDER — GLYCOPYRROLATE PF 0.2 MG/ML IJ SOSY
PREFILLED_SYRINGE | INTRAMUSCULAR | Status: DC | PRN
  Administered 2022-08-25: .2 mg via INTRAVENOUS

## 2022-08-25 MED ORDER — MEPERIDINE HCL 50 MG/ML IJ SOLN: 6.2500 mg | INTRAMUSCULAR | Status: DC | PRN

## 2022-08-25 MED ORDER — LIDOCAINE HCL (PF) 2 % IJ SOLN
INTRAMUSCULAR | Status: AC
  Filled 2022-08-25: qty 5

## 2022-08-25 SURGICAL SUPPLY — 55 items
ADH SKN CLS APL DERMABOND .7 (GAUZE/BANDAGES/DRESSINGS) ×1
APL PRP STRL LF DISP 70% ISPRP (MISCELLANEOUS) ×1
CHLORAPREP W/TINT 26 (MISCELLANEOUS) ×1 IMPLANT
COVER LIGHT HANDLE STERIS (MISCELLANEOUS) ×1 IMPLANT
COVER MAYO STAND STRL (DRAPES) ×1 IMPLANT
COVER MAYO STAND XLG (MISCELLANEOUS) ×1 IMPLANT
COVER TIP SHEARS 8 DVNC (MISCELLANEOUS) ×1 IMPLANT
COVER TIP SHEARS 8MM DA VINCI (MISCELLANEOUS) ×1
COVER WAND RF STERILE (DRAPES) ×1 IMPLANT
DEFOGGER SCOPE WARMER CLEARIFY (MISCELLANEOUS) IMPLANT
DERMABOND ADVANCED .7 DNX12 (GAUZE/BANDAGES/DRESSINGS) ×1 IMPLANT
DRAPE ARM DVNC X/XI (DISPOSABLE) ×3 IMPLANT
DRAPE COLUMN DVNC XI (DISPOSABLE) ×1 IMPLANT
DRAPE DA VINCI XI ARM (DISPOSABLE) ×3
DRAPE DA VINCI XI COLUMN (DISPOSABLE) ×1
DRAPE HALF SHEET 40X57 (DRAPES) ×1 IMPLANT
ELECT REM PT RETURN 9FT ADLT (ELECTROSURGICAL) ×1
ELECTRODE REM PT RTRN 9FT ADLT (ELECTROSURGICAL) ×1 IMPLANT
GAUZE SPONGE 4X4 12PLY STRL (GAUZE/BANDAGES/DRESSINGS) ×1 IMPLANT
GLOVE BIOGEL PI IND STRL 7.0 (GLOVE) ×4 IMPLANT
GLOVE SURG SS PI 7.5 STRL IVOR (GLOVE) ×2 IMPLANT
GOWN STRL REUS W/TWL LRG LVL3 (GOWN DISPOSABLE) ×2 IMPLANT
GRASPER SUT TROCAR 14GX15 (MISCELLANEOUS) IMPLANT
IRRIGATOR SUCT 8 DISP DVNC XI (IRRIGATION / IRRIGATOR) IMPLANT
IRRIGATOR SUCTION 8MM XI DISP (IRRIGATION / IRRIGATOR)
IV NS IRRIG 3000ML ARTHROMATIC (IV SOLUTION) IMPLANT
KIT PINK PAD W/HEAD ARE REST (MISCELLANEOUS) ×1
KIT PINK PAD W/HEAD ARM REST (MISCELLANEOUS) ×1 IMPLANT
KIT TURNOVER KIT A (KITS) ×1 IMPLANT
MANIFOLD NEPTUNE II (INSTRUMENTS) ×1 IMPLANT
MESH 3DMAX MID 5X7 LT XLRG (Mesh General) IMPLANT
NDL HYPO 21X1.5 SAFETY (NEEDLE) ×1 IMPLANT
NDL INSUFFLATION 14GA 120MM (NEEDLE) ×1 IMPLANT
NEEDLE HYPO 21X1.5 SAFETY (NEEDLE) ×1 IMPLANT
NEEDLE INSUFFLATION 14GA 120MM (NEEDLE) ×1 IMPLANT
OBTURATOR OPTICAL STANDARD 8MM (TROCAR) ×1
OBTURATOR OPTICAL STND 8 DVNC (TROCAR) ×1
OBTURATOR OPTICALSTD 8 DVNC (TROCAR) ×1 IMPLANT
PACK LAP CHOLE LZT030E (CUSTOM PROCEDURE TRAY) ×1 IMPLANT
PENCIL HANDSWITCHING (ELECTRODE) ×1 IMPLANT
SEAL CANN UNIV 5-8 DVNC XI (MISCELLANEOUS) ×3 IMPLANT
SEAL XI 5MM-8MM UNIVERSAL (MISCELLANEOUS) ×3
SET BASIN LINEN APH (SET/KITS/TRAYS/PACK) ×1 IMPLANT
SET TUBE SMOKE EVAC HIGH FLOW (TUBING) ×1 IMPLANT
SOL PREP POV-IOD 4OZ 10% (MISCELLANEOUS) ×1 IMPLANT
SUT MNCRL AB 4-0 PS2 18 (SUTURE) ×2 IMPLANT
SUT V-LOC 90 ABS 3-0 VLT  V-20 (SUTURE) ×2
SUT V-LOC 90 ABS 3-0 VLT V-20 (SUTURE) ×2 IMPLANT
SUT VIC AB 2-0 SH 27 (SUTURE) ×1
SUT VIC AB 2-0 SH 27X BRD (SUTURE) ×1 IMPLANT
SYR 20ML LL LF (SYRINGE) ×1 IMPLANT
TAPE TRANSPORE STRL 2 31045 (GAUZE/BANDAGES/DRESSINGS) ×1 IMPLANT
TRAY FOL W/BAG SLVR 16FR STRL (SET/KITS/TRAYS/PACK) ×1 IMPLANT
TRAY FOLEY W/BAG SLVR 16FR LF (SET/KITS/TRAYS/PACK) ×1
WATER STERILE IRR 500ML POUR (IV SOLUTION) ×1 IMPLANT

## 2022-08-25 NOTE — Interval H&P Note (Signed)
History and Physical Interval Note:  08/25/2022 7:04 AM  Eugene Hill  has presented today for surgery, with the diagnosis of LEFT INGUINAL HERNIA.  The various methods of treatment have been discussed with the patient and family. After consideration of risks, benefits and other options for treatment, the patient has consented to  Procedure(s): XI ROBOTIC [place] (Left) as a surgical intervention.  The patient's history has been reviewed, patient examined, no change in status, stable for surgery.  I have reviewed the patient's chart and labs.  Questions were answered to the patient's satisfaction.     Aviva Signs

## 2022-08-25 NOTE — Anesthesia Preprocedure Evaluation (Signed)
Anesthesia Evaluation  Patient identified by MRN, date of birth, ID band Patient awake    Reviewed: Allergy & Precautions, H&P , NPO status , Patient's Chart, lab work & pertinent test results  History of Anesthesia Complications Negative for: history of anesthetic complications  Airway Mallampati: III  TM Distance: >3 FB Neck ROM: Full   Comment: Neck sx Dental no notable dental hx. (+) Dental Advisory Given, Caps   Pulmonary sleep apnea (snoring???)    Pulmonary exam normal breath sounds clear to auscultation       Cardiovascular Exercise Tolerance: Good hypertension, Pt. on medications Normal cardiovascular exam Rhythm:Regular Rate:Normal     Neuro/Psych negative neurological ROS  negative psych ROS   GI/Hepatic negative GI ROS, Neg liver ROS,,,  Endo/Other  negative endocrine ROS    Renal/GU negative Renal ROS  negative genitourinary   Musculoskeletal  (+) Arthritis , Osteoarthritis,    Abdominal   Peds negative pediatric ROS (+)  Hematology negative hematology ROS (+)   Anesthesia Other Findings   Reproductive/Obstetrics negative OB ROS                             Anesthesia Physical Anesthesia Plan  ASA: 2  Anesthesia Plan: General   Post-op Pain Management: Dilaudid IV   Induction: Intravenous  PONV Risk Score and Plan: 4 or greater and Ondansetron, Dexamethasone and Midazolam  Airway Management Planned: Oral ETT and Video Laryngoscope Planned  Additional Equipment:   Intra-op Plan:   Post-operative Plan: Extubation in OR  Informed Consent: I have reviewed the patients History and Physical, chart, labs and discussed the procedure including the risks, benefits and alternatives for the proposed anesthesia with the patient or authorized representative who has indicated his/her understanding and acceptance.     Dental advisory given  Plan Discussed with: CRNA and  Surgeon  Anesthesia Plan Comments:         Anesthesia Quick Evaluation

## 2022-08-25 NOTE — Anesthesia Postprocedure Evaluation (Signed)
Anesthesia Post Note  Patient: Eugene Hill  Procedure(s) Performed: XI ROBOTIC ASSISTED INGUINAL HERNIA REPAIR WITH MESH (Left: Groin)  Patient location during evaluation: Phase II Anesthesia Type: General Level of consciousness: awake and alert and oriented Pain management: pain level controlled Vital Signs Assessment: post-procedure vital signs reviewed and stable Respiratory status: spontaneous breathing, nonlabored ventilation and respiratory function stable Cardiovascular status: blood pressure returned to baseline and stable Postop Assessment: no apparent nausea or vomiting Anesthetic complications: no  No notable events documented.   Last Vitals:  Vitals:   08/25/22 1040 08/25/22 1058  BP: 127/83 116/82  Pulse: (!) 59 61  Resp: 16 17  Temp:  36.5 C  SpO2: 93% 92%    Last Pain:  Vitals:   08/25/22 1058  TempSrc: Oral  PainSc: 3                  Lavella Myren C Lizanne Erker

## 2022-08-25 NOTE — Anesthesia Procedure Notes (Addendum)
Procedure Name: Intubation Date/Time: 08/25/2022 7:44 AM  Performed by: Orlie Dakin, CRNAPre-anesthesia Checklist: Patient identified, Emergency Drugs available, Suction available and Patient being monitored Patient Re-evaluated:Patient Re-evaluated prior to induction Oxygen Delivery Method: Circle system utilized Preoxygenation: Pre-oxygenation with 100% oxygen Induction Type: IV induction Ventilation: Mask ventilation without difficulty and Oral airway inserted - appropriate to patient size Laryngoscope Size: Glidescope and 3 Grade View: Grade I Tube type: Oral Tube size: 7.5 mm Number of attempts: 1 Airway Equipment and Method: Stylet and Video-laryngoscopy Placement Confirmation: ETT inserted through vocal cords under direct vision, positive ETCO2 and breath sounds checked- equal and bilateral Secured at: 23 cm Tube secured with: Tape Dental Injury: Teeth and Oropharynx as per pre-operative assessment  Comments: Glidescope electively used due to H/O snoring, H/O neck fusion.  Noted short thick neck, small mouth opening, large tongue, MP 3.

## 2022-08-25 NOTE — Op Note (Signed)
Patient:  Eugene Hill  DOB:  20-Apr-1961  MRN:  [id number]   Preop Diagnosis: Left inguinal hernia  Postop Diagnosis: Same  Procedure: Robotic assisted laparoscopic left inguinal herniorrhaphy with mesh  Surgeon: Aviva Signs, MD  Anes: General endotracheal  Indications: Patient is a 62 year old white male who presents with a symptomatic left inguinal hernia.  The risks and benefits of the procedure including bleeding, infection, mesh use, and the possibility of recurrence of the hernia were fully explained to the patient, who gave informed consent.  Procedure note: The patient was placed in the Trendelenburg position after the abdomen was prepped and draped using the usual sterile technique with ChloraPrep.  Surgical site confirmation was performed.  A Veress needle was introduced into the peritoneum in the left upper quadrant without difficulty.  Confirmation of placement was done using the saline drop test.  The abdomen was then insufflated to 15 mmHg pressure.  An 8 mm trocar was introduced into the abdominal cavity under direct visualization without difficulty.  An additional 8 mm trocar was placed in the supraumbilical region along the midline and in the right upper quadrant regions.  The robot was then targeted and docked.  The inguinal region was inspected.  No right inguinal hernia was noted.  A left inguinal hernia was noted with small bowel contents present.  These were reduced.  The peritoneal flap was then performed from a point superior to the ASIS to the midline.  The peritoneum was then dissected down immediately to Cooper's ligament and laterally below the shelving edge.  The indirect hernia along with a lipoma were reduced.  A posterior peritoneum was approximately 6 cm below the hernia defect.  An extra-large Bard 3D max mesh was then inserted and secured to Cooper's ligament using a 3-0 Vicryl suture.  The peritoneal flap was closed using a 3 oh V-Loc running suture.  The  abdomen was then deflated and the peritoneum adhesed to the mesh without difficulty.  All remaining air and trocars were then removed and the robot undocked.  All wounds were irrigated with normal saline.  All wounds were injected with Exparel.  All incisions were closed using a 4-0 Monocryl subcuticular suture.  Dermabond was applied.  All tape and needle counts were correct at the end of the procedure.  The patient was extubated in the operating room and transferred to PACU in stable condition.  Complications: None  EBL: Minimal  Specimen: None

## 2022-08-25 NOTE — Transfer of Care (Signed)
Immediate Anesthesia Transfer of Care Note  Patient: Eugene Hill  Procedure(s) Performed: XI ROBOTIC ASSISTED INGUINAL HERNIA REPAIR WITH MESH (Left: Groin)  Patient Location: PACU  Anesthesia Type:General  Level of Consciousness: drowsy  Airway & Oxygen Therapy: Patient Spontanous Breathing and Patient connected to face mask oxygen  Post-op Assessment: Report given to RN and Post -op Vital signs reviewed and stable  Post vital signs: Reviewed and stable  Last Vitals:  Vitals Value Taken Time  BP 112/73 08/25/22 0956  Temp    Pulse 63 08/25/22 0956  Resp 13 08/25/22 0956  SpO2 100 % 08/25/22 0956  Vitals shown include unvalidated device data.  Last Pain:  Vitals:   08/25/22 [id number]  TempSrc: Oral         Complications: No notable events documented.

## 2022-08-30 ENCOUNTER — Telehealth: Payer: Self-pay | Admitting: Family Medicine

## 2022-08-30 NOTE — Telephone Encounter (Signed)
[employer] Paperwork completed and faxed to [phone]. Received confirmation.    Out of work 08/25/2022 and may return unrestricted on 10/23/2022.

## 2022-09-07 ENCOUNTER — Encounter (HOSPITAL_COMMUNITY): Payer: Self-pay | Admitting: General Surgery

## 2022-09-19 ENCOUNTER — Ambulatory Visit (INDEPENDENT_AMBULATORY_CARE_PROVIDER_SITE_OTHER): Payer: 59 | Admitting: General Surgery

## 2022-09-19 ENCOUNTER — Other Ambulatory Visit: Payer: Self-pay

## 2022-09-19 ENCOUNTER — Encounter: Payer: Self-pay | Admitting: General Surgery

## 2022-09-19 VITALS — BP 186/126 | HR 72 | Temp 98.0°F | Resp 20 | Ht 70.0 in | Wt 244.0 lb

## 2022-09-19 DIAGNOSIS — Z09 Encounter for follow-up examination after completed treatment for conditions other than malignant neoplasm: Secondary | ICD-10-CM

## 2022-09-19 NOTE — Progress Notes (Signed)
Subjective:     Eugene Hill  Patient here for postoperative visit, status post robotic assisted laparoscopic left inguinal herniorrhaphy with mesh.  He is doing well.  He has no significant left groin pain.  No swelling is present. Objective:    BP (!) 186/126 (BP Location: Right Arm, Patient Position: Sitting, Cuff Size: Large)   Pulse 72   Temp 98 F (36.7 C) (Oral)   Resp 20   Ht 5\' 10"  (1.778 m)   Wt 244 lb (110.7 kg)   SpO2 95%   BMI 35.01 kg/m   General:  alert, cooperative, and no distress  Abdomen is soft, incisions healing well.  There is a small subcutaneous knot below the upper midline incision.  It is not reducible.  No swelling in the left groin region noted.     Assessment:    Doing well postoperatively. Suspect resolving hematoma of mid trocar site.    Plan:   May increase activity as able.  May return to work 6 weeks after his surgery without restrictions.  Follow-up here as needed.

## 2022-12-04 ENCOUNTER — Ambulatory Visit: Payer: 59 | Admitting: Orthopedic Surgery

## 2022-12-04 ENCOUNTER — Encounter: Payer: Self-pay | Admitting: Orthopedic Surgery

## 2022-12-04 ENCOUNTER — Other Ambulatory Visit (INDEPENDENT_AMBULATORY_CARE_PROVIDER_SITE_OTHER): Payer: 59

## 2022-12-04 VITALS — BP 156/99 | HR 83 | Ht 70.0 in | Wt 240.0 lb

## 2022-12-04 DIAGNOSIS — M1711 Unilateral primary osteoarthritis, right knee: Secondary | ICD-10-CM | POA: Diagnosis not present

## 2022-12-04 DIAGNOSIS — G8929 Other chronic pain: Secondary | ICD-10-CM

## 2022-12-04 DIAGNOSIS — M25561 Pain in right knee: Secondary | ICD-10-CM | POA: Diagnosis not present

## 2022-12-04 NOTE — Patient Instructions (Signed)
We will check which brand of Hyaluronic acid injections (there are several) your insurance covers. We will call you with price and schedule with you if insurance approves and you are okay with the out of pocket costs. If for any reason they will not cover or the out of pocket costs are high, we will discuss with you and let you know other options available. This process normally takes several weeks to hear back from us the insurance approval process takes time.   

## 2022-12-04 NOTE — Progress Notes (Signed)
Chief Complaint  Patient presents with   Knee Pain    Right    Was last seen in January 2023 he was treated with NSAIDs and Euflexxa HA injections he did well for about a year and has noticed that he is fine when he does not work but if he does a lot of walking especially at work he has knee pain  He just went on a trip and did fine while walking but went to work that night it was killing him so he is here for evaluation of that  He is a pretty benign exam instability but tender in all 3 compartments  X-ray will be obtained  varus alignment joint space narrowing cyst formation subchondral sclerosis grade 3 arthritis  Encounter Diagnoses  Name Primary?   Chronic pain of right knee Yes   Primary osteoarthritis of right knee    Osteoarthritis condition with exacerbation of symptoms  Recommend cortisone injection with repeat HA injections  Procedure note right knee injection   verbal consent was obtained to inject right knee joint  Timeout was completed to confirm the site of injection  The medications used were depomedrol 40 mg and 1% lidocaine 3 cc Anesthesia was provided by ethyl chloride and the skin was prepped with alcohol.  After cleaning the skin with alcohol a 20-gauge needle was used to inject the right knee joint. There were no complications. A sterile bandage was applied.

## 2022-12-05 ENCOUNTER — Telehealth: Payer: Self-pay

## 2022-12-05 NOTE — Telephone Encounter (Signed)
-----   Message from Caffie Damme, RT sent at 12/04/2022 12:14 PM EDT ----- Hyaluronic acid injections right knee

## 2022-12-05 NOTE — Telephone Encounter (Signed)
-----   Message from [facility] May, RT sent at 12/05/2022  2:23 PM EDT ----- R knee gel inj, last had euflexxa 11/22 ----- Message ----- From: Vickki Hearing, MD Sent: 12/04/2022  12:13 PM EDT To: Toniann Fail May, RT  HA APPROVAL PLEASE

## 2022-12-05 NOTE — Telephone Encounter (Signed)
VOB submitted for euflexxa, right knee.  

## 2022-12-05 NOTE — Telephone Encounter (Signed)
VOB already submitted. See previous message.

## 2022-12-15 ENCOUNTER — Telehealth: Payer: Self-pay

## 2022-12-15 NOTE — Telephone Encounter (Signed)
Please schedule patient for gel injections with Dr. Romeo Apple.  All gel injection information can be located under the referrals tab.  Thank you

## 2022-12-27 ENCOUNTER — Telehealth: Payer: Self-pay | Admitting: Orthopedic Surgery

## 2022-12-27 NOTE — Telephone Encounter (Signed)
LVM for the patient to call us back to schedule his gel injections:  Approved for Euflexxa, right knee. Buy & Bill Patient is covered at 100% of the allowable amount Co-pay of $17.00-may be per visit No PA required

## 2023-01-01 ENCOUNTER — Ambulatory Visit: Payer: 59 | Admitting: Orthopedic Surgery

## 2023-01-01 ENCOUNTER — Encounter: Payer: Self-pay | Admitting: Orthopedic Surgery

## 2023-01-01 VITALS — BP 155/108 | HR 72 | Ht 70.0 in | Wt 254.0 lb

## 2023-01-01 DIAGNOSIS — G8929 Other chronic pain: Secondary | ICD-10-CM

## 2023-01-01 DIAGNOSIS — Z9889 Other specified postprocedural states: Secondary | ICD-10-CM

## 2023-01-01 DIAGNOSIS — M1711 Unilateral primary osteoarthritis, right knee: Secondary | ICD-10-CM

## 2023-01-01 MED ORDER — SODIUM HYALURONATE (VISCOSUP) 20 MG/2ML IX SOSY
20.0000 mg | PREFILLED_SYRINGE | Freq: Once | INTRA_ARTICULAR | Status: AC
  Administered 2023-01-01: 20 mg via INTRA_ARTICULAR

## 2023-01-01 NOTE — Progress Notes (Signed)
Chief Complaint  Patient presents with   Knee Pain    Euflexa right knee   Encounter Diagnoses  Name Primary?   Primary osteoarthritis of right knee Yes   Chronic pain of right knee    S/P right knee arthroscopy    1st euflexa injection   Chief Complaint  Patient presents with   Knee Pain    Euflexa right knee    Encounter Diagnoses  Name Primary?   Primary osteoarthritis of right knee Yes   Chronic pain of right knee    S/P right knee arthroscopy     The patient has consented to and requested hyaluronic acid injection   MEDICATION: EUFLEXA  RIGHT   THE knee is prepped with alcohol and ethyl chloride  The injection is performed with a 21-gauge needle, via inferolateral approach  No complications were noted  Appropriate instructions post injection were given   in 1 week(s)

## 2023-01-08 ENCOUNTER — Encounter: Payer: Self-pay | Admitting: Orthopedic Surgery

## 2023-01-08 ENCOUNTER — Ambulatory Visit: Payer: 59 | Admitting: Orthopedic Surgery

## 2023-01-08 DIAGNOSIS — G8929 Other chronic pain: Secondary | ICD-10-CM

## 2023-01-08 DIAGNOSIS — M1711 Unilateral primary osteoarthritis, right knee: Secondary | ICD-10-CM | POA: Diagnosis not present

## 2023-01-08 MED ORDER — SODIUM HYALURONATE (VISCOSUP) 20 MG/2ML IX SOSY
20.0000 mg | PREFILLED_SYRINGE | Freq: Once | INTRA_ARTICULAR | Status: AC
  Administered 2023-01-08: 20 mg via INTRA_ARTICULAR

## 2023-01-08 NOTE — Progress Notes (Addendum)
Chief Complaint  Patient presents with   Injections    Right knee euflexxa 2    Chief Complaint  Patient presents with   Injections    Right knee euflexxa 2    Encounter Diagnoses  Name Primary?   Primary osteoarthritis of right knee Yes   Chronic pain of right knee     The patient has consented to and requested hyaluronic acid injection   MEDICATION: Euflexxa  Right   THE knee is prepped with alcohol and ethyl chloride  The injection is performed with a 21-gauge needle, via inferolateral approach  No complications were noted  Appropriate instructions post injection were given  Return 1 week for third injection

## 2023-01-15 ENCOUNTER — Ambulatory Visit: Payer: 59 | Admitting: Orthopedic Surgery

## 2023-01-15 DIAGNOSIS — M1711 Unilateral primary osteoarthritis, right knee: Secondary | ICD-10-CM

## 2023-01-15 MED ORDER — SODIUM HYALURONATE (VISCOSUP) 20 MG/2ML IX SOSY
20.0000 mg | PREFILLED_SYRINGE | Freq: Once | INTRA_ARTICULAR | Status: AC
  Administered 2023-01-15: 20 mg via INTRA_ARTICULAR

## 2023-01-15 NOTE — Progress Notes (Signed)
Chief Complaint  Patient presents with   Follow-up    Euflexxa inj right knee # 3    Chief Complaint  Patient presents with   Follow-up    Euflexxa inj right knee # 3     Encounter Diagnosis  Name Primary?   Primary osteoarthritis of right knee Yes    The patient has consented to and requested hyaluronic acid injection   MEDICATION: EUFLEXA  RIGHT   THE knee is prepped with alcohol and ethyl chloride  The injection is performed with a 21-gauge needle, via inferolateral approach  No complications were noted  Appropriate instructions post injection were given   prn

## 2023-05-14 ENCOUNTER — Other Ambulatory Visit (HOSPITAL_COMMUNITY): Payer: Self-pay | Admitting: Internal Medicine

## 2023-05-14 DIAGNOSIS — I712 Thoracic aortic aneurysm, without rupture, unspecified: Secondary | ICD-10-CM

## 2023-06-21 ENCOUNTER — Ambulatory Visit (HOSPITAL_COMMUNITY): Admission: RE | Admit: 2023-06-21 | Payer: 59 | Source: Ambulatory Visit

## 2023-06-21 ENCOUNTER — Encounter (HOSPITAL_COMMUNITY): Payer: Self-pay

## 2023-07-05 ENCOUNTER — Encounter: Payer: Self-pay | Admitting: *Deleted

## 2023-09-26 ENCOUNTER — Ambulatory Visit (HOSPITAL_BASED_OUTPATIENT_CLINIC_OR_DEPARTMENT_OTHER): Admitting: Orthopaedic Surgery

## 2023-09-26 ENCOUNTER — Ambulatory Visit (HOSPITAL_BASED_OUTPATIENT_CLINIC_OR_DEPARTMENT_OTHER)

## 2023-09-26 DIAGNOSIS — M1711 Unilateral primary osteoarthritis, right knee: Secondary | ICD-10-CM | POA: Diagnosis not present

## 2023-09-26 DIAGNOSIS — M79671 Pain in right foot: Secondary | ICD-10-CM

## 2023-09-26 MED ORDER — TRIAMCINOLONE ACETONIDE 40 MG/ML IJ SUSP
80.0000 mg | INTRAMUSCULAR | Status: AC | PRN
  Administered 2023-09-26: 80 mg via INTRA_ARTICULAR

## 2023-09-26 MED ORDER — LIDOCAINE HCL 1 % IJ SOLN
4.0000 mL | INTRAMUSCULAR | Status: AC | PRN
  Administered 2023-09-26: 4 mL

## 2023-09-26 NOTE — Progress Notes (Signed)
 Chief Complaint: Right knee pain     History of Present Illness:    Eugene Hill is a 63 y.o. male today with ongoing right knee pain in the setting of 2 previous meniscal debridements with Dr. Romeo Apple.  He did get relief from these initially but has subsequently had some additional medial based pain.  His first surgery was done by Dr. Hilda Lias.  At this point he is having some swelling and pain along the inner aspect of the knee.  He has been trialing anti-inflammatories with some relief.  He is here today for further discussion    PMH/PSH/Family History/Social History/Meds/Allergies:    Past Medical History:  Diagnosis Date  . Arthritis   . Hypertension    Past Surgical History:  Procedure Laterality Date  . COLONOSCOPY N/A 08/15/2013   Procedure: COLONOSCOPY;  Surgeon: West Bali, MD;  Location: [facility];  Service: Endoscopy;  Laterality: N/A;  9:15 AM  . KNEE ARTHROSCOPY WITH MEDIAL MENISECTOMY Right 07/31/2014   Procedure: KNEE ARTHROSCOPY WITH MEDIAL AND LATERAL MENISECTOMY;  Surgeon: Vickki Hearing, MD;  Location: [facility];  Service: Orthopedics;  Laterality: Right;  . Left broken arm     screws and plate  . NECK SURGERY     cervical c4- c5-screws  . Right knee arthroscopy  06/26/1985  . XI ROBOTIC ASSISTED INGUINAL HERNIA REPAIR WITH MESH Left 08/25/2022   Procedure: XI ROBOTIC ASSISTED INGUINAL HERNIA REPAIR WITH MESH;  Surgeon: Franky Macho, MD;  Location: [facility];  Service: General;  Laterality: Left;   Social History   Socioeconomic History  . Marital status: Married    Spouse name: Not on file  . Number of children: Not on file  . Years of education: Not on file  . Highest education level: Not on file  Occupational History  . Not on file  Tobacco Use  . Smoking status: Never  . Smokeless tobacco: Current    Types: Chew  Vaping Use  . Vaping status: Never Used  Substance and Sexual Activity  . Alcohol use: Yes    Alcohol/week: 3.0  standard drinks of alcohol    Types: 3 Cans of beer per week  . Drug use: No  . Sexual activity: Yes    Birth control/protection: None  Other Topics Concern  . Not on file  Social History Narrative  . Not on file   Social Drivers of Health   Financial Resource Strain: Not on file  Food Insecurity: Not on file  Transportation Needs: Not on file  Physical Activity: Not on file  Stress: Not on file  Social Connections: Not on file   Family History  Problem Relation Age of Onset  . Dementia Paternal Uncle   . Colon cancer Neg Hx    No Known Allergies Current Outpatient Medications  Medication Sig Dispense Refill  . amLODipine (NORVASC) 5 MG tablet Take 5 mg by mouth daily.    . celecoxib (CELEBREX) 200 MG capsule Take 200 mg by mouth daily as needed for moderate pain.    . cholecalciferol (VITAMIN D3) 25 MCG (1000 UNIT) tablet Take 1,000 Units by mouth daily.    . Multiple Vitamin (MULTIVITAMIN) tablet Take 1 tablet by mouth daily.    Marland Kitchen olmesartan (BENICAR) 40 MG tablet Take 40 mg by mouth daily.     No current facility-administered medications for this visit.   No results found.  Review of Systems:   A ROS was performed including pertinent positives  and negatives as documented in the HPI.  Physical Exam :   Constitutional: NAD and appears stated age Neurological: Alert and oriented Psych: Appropriate affect and cooperative There were no vitals taken for this visit.   Comprehensive Musculoskeletal Exam:    Right knee with tenderness about the medial joint line, no lateral joint line tenderness, negative patellofemoral type symptoms.  Range of motion is from 0 to 120 degrees without crepitus.  Distal neurosensory exam is intact, negative Lachman, negative posterior drawer   Imaging:   Xray (4 views right knee): Medial joint space narrowing consistent with mild osteoarthritis    I personally reviewed and interpreted the radiographs.   Assessment and Plan:   63  y.o. male with moderate osteoarthritis of the right knee.  I did describe that at this time he has not had many previous steroid injections and I do believe this would ultimately give him some amount of prolonged relief.  I did discuss the possibility of an MRI for assessment of his lateral patellofemoral joint lines as I do ultimately believe he may be a candidate for partial knee replacement.  He will consider this in the future.  He would like to start with right knee anterior articular injection  -Plan for right knee ultrasound-guided injection today    Procedure Note  Patient: Eugene Hill             Date of Birth: 1960/09/16           MRN: [id number]             Visit Date: 09/26/2023  Procedures: Visit Diagnoses:  1. Pain in right foot     Large Joint Inj: R knee on 09/26/2023 12:29 PM Indications: pain Details: 22 G 1.5 in needle, ultrasound-guided anterior approach  Arthrogram: No  Medications: 4 mL lidocaine 1 %; 80 mg triamcinolone acetonide 40 MG/ML Outcome: tolerated well, no immediate complications Procedure, treatment alternatives, risks and benefits explained, specific risks discussed. Consent was given by the patient. Immediately prior to procedure a time out was called to verify the correct patient, procedure, equipment, support staff and site/side marked as required. Patient was prepped and draped in the usual sterile fashion.        I personally saw and evaluated the patient, and participated in the management and treatment plan.  Huel Cote, MD Attending Physician, Orthopedic Surgery  This document was dictated using Dragon voice recognition software. A reasonable attempt at proof reading has been made to minimize errors.

## 2023-12-06 ENCOUNTER — Other Ambulatory Visit (HOSPITAL_COMMUNITY): Payer: Self-pay | Admitting: Internal Medicine

## 2023-12-06 DIAGNOSIS — I712 Thoracic aortic aneurysm, without rupture, unspecified: Secondary | ICD-10-CM

## 2023-12-13 ENCOUNTER — Ambulatory Visit (HOSPITAL_COMMUNITY)
Admission: RE | Admit: 2023-12-13 | Discharge: 2023-12-13 | Disposition: A | Discharge status: Home / Self Care | Source: Ambulatory Visit | Attending: Internal Medicine | Admitting: Internal Medicine

## 2023-12-13 DIAGNOSIS — I712 Thoracic aortic aneurysm, without rupture, unspecified: Secondary | ICD-10-CM | POA: Diagnosis present

## 2023-12-13 MED ORDER — IOHEXOL 350 MG/ML SOLN
100.0000 mL | Freq: Once | INTRAVENOUS | Status: AC | PRN
  Administered 2023-12-13: 100 mL via INTRAVENOUS

## 2024-01-09 ENCOUNTER — Encounter (HOSPITAL_COMMUNITY): Payer: Self-pay | Admitting: Emergency Medicine

## 2024-01-09 ENCOUNTER — Other Ambulatory Visit: Payer: Self-pay

## 2024-01-09 ENCOUNTER — Emergency Department (HOSPITAL_COMMUNITY)
Admission: EM | Admit: 2024-01-09 | Discharge: 2024-01-10 | Disposition: A | Discharge status: Home / Self Care | Attending: Emergency Medicine | Admitting: Emergency Medicine

## 2024-01-09 DIAGNOSIS — I10 Essential (primary) hypertension: Secondary | ICD-10-CM | POA: Diagnosis not present

## 2024-01-09 DIAGNOSIS — R1084 Generalized abdominal pain: Secondary | ICD-10-CM | POA: Insufficient documentation

## 2024-01-09 DIAGNOSIS — Z79899 Other long term (current) drug therapy: Secondary | ICD-10-CM | POA: Insufficient documentation

## 2024-01-09 DIAGNOSIS — R197 Diarrhea, unspecified: Secondary | ICD-10-CM | POA: Insufficient documentation

## 2024-01-09 LAB — CBC
HCT: 46.1 % (ref 39.0–52.0)
Hemoglobin: 15.7 g/dL (ref 13.0–17.0)
MCH: 30.9 pg (ref 26.0–34.0)
MCHC: 34.1 g/dL (ref 30.0–36.0)
MCV: 90.7 fL (ref 80.0–100.0)
Platelets: 273 K/uL (ref 150–400)
RBC: 5.08 MIL/uL (ref 4.22–5.81)
RDW: 12.6 % (ref 11.5–15.5)
WBC: 8.3 K/uL (ref 4.0–10.5)
nRBC: 0 % (ref 0.0–0.2)

## 2024-01-09 NOTE — ED Triage Notes (Signed)
 Pt with c/o diarrhea x 3 weeks, vomiting and heartburn x 2 days.

## 2024-01-10 ENCOUNTER — Emergency Department (HOSPITAL_COMMUNITY)

## 2024-01-10 LAB — COMPREHENSIVE METABOLIC PANEL WITH GFR
ALT: 34 U/L (ref 0–44)
AST: 16 U/L (ref 15–41)
Albumin: 3.9 g/dL (ref 3.5–5.0)
Alkaline Phosphatase: 54 U/L (ref 38–126)
Anion gap: 13 (ref 5–15)
BUN: 18 mg/dL (ref 6–20)
CO2: 23 mmol/L (ref 22–32)
Calcium: 8.9 mg/dL (ref 8.9–10.3)
Chloride: 101 mmol/L (ref 98–111)
Creatinine, Ser: 0.94 mg/dL (ref 0.61–1.24)
GFR, Estimated: >60 mL/min (ref >60)
Glucose, Bld: 134 mg/dL — ABNORMAL HIGH (ref 70–99)
Potassium: 3.8 mmol/L (ref 3.5–5.1)
Sodium: 137 mmol/L (ref 135–145)
Total Bilirubin: 1.1 mg/dL (ref 0.0–1.2)
Total Protein: 7.7 g/dL (ref 6.5–8.1)

## 2024-01-10 LAB — LIPASE, BLOOD: Lipase: 25 U/L (ref 11–51)

## 2024-01-10 MED ORDER — METRONIDAZOLE 500 MG PO TABS: 500.0000 mg | ORAL_TABLET | Freq: Three times a day (TID) | ORAL | 0 refills | Status: AC

## 2024-01-10 MED ORDER — SODIUM CHLORIDE 0.9 % IV BOLUS
1000.0000 mL | Freq: Once | INTRAVENOUS | Status: AC
  Administered 2024-01-10: 1000 mL via INTRAVENOUS

## 2024-01-10 MED ORDER — ONDANSETRON HCL 4 MG/2ML IJ SOLN
4.0000 mg | Freq: Once | INTRAMUSCULAR | Status: AC
  Administered 2024-01-10: 4 mg via INTRAVENOUS
  Filled 2024-01-10: qty 2

## 2024-01-10 MED ORDER — IOHEXOL 300 MG/ML  SOLN
100.0000 mL | Freq: Once | INTRAMUSCULAR | Status: AC | PRN
  Administered 2024-01-10: 100 mL via INTRAVENOUS

## 2024-01-10 MED ORDER — METRONIDAZOLE 500 MG PO TABS
500.0000 mg | ORAL_TABLET | Freq: Once | ORAL | Status: AC
  Administered 2024-01-10: 500 mg via ORAL
  Filled 2024-01-10: qty 1

## 2024-01-10 MED ORDER — KETOROLAC TROMETHAMINE 30 MG/ML IJ SOLN
30.0000 mg | Freq: Once | INTRAMUSCULAR | Status: AC
  Administered 2024-01-10: 30 mg via INTRAVENOUS
  Filled 2024-01-10: qty 1

## 2024-01-10 NOTE — ED Provider Notes (Signed)
 [place] EMERGENCY DEPARTMENT AT [facility] Provider Note   CSN: [id number] Arrival date & time: 01/09/24  2313     Patient presents with: Emesis and Diarrhea   Eugene Hill is a 63 y.o. male.   Patient is a 63 year old male with past medical history of hypertension.  Patient presenting today for evaluation of nausea and vomiting and diarrhea.  He tells me that he has had the diarrhea off and on for the past several weeks, then had episodes of vomiting and heartburn today.  All has been nonbloody.  He denies any fevers or chills.  He does report generalized abdominal discomfort.  Only prior surgery is hernia repair.       Prior to Admission medications   Medication Sig Start Date End Date Taking? Authorizing Provider  amLODipine (NORVASC) 5 MG tablet Take 5 mg by mouth daily. 12/19/22   [provider]  celecoxib (CELEBREX) 200 MG capsule Take 200 mg by mouth daily as needed for moderate pain. 05/08/22   [provider]  cholecalciferol (VITAMIN D3) 25 MCG (1000 UNIT) tablet Take 1,000 Units by mouth daily.    [provider]  Multiple Vitamin (MULTIVITAMIN) tablet Take 1 tablet by mouth daily.    [provider]  olmesartan (BENICAR) 40 MG tablet Take 40 mg by mouth daily. 11/13/22   [provider]    Allergies: Patient has no known allergies.    Review of Systems  All other systems reviewed and are negative.   Updated Vital Signs BP (!) 140/84 (BP Location: Left Arm)   Pulse 82   Temp 97.9 F (36.6 C) (Oral)   Resp 18   Ht 5' 10 (1.778 m)   Wt 109.8 kg   SpO2 94%   BMI 34.72 kg/m   Physical Exam Vitals and nursing note reviewed.  Constitutional:      General: He is not in acute distress.    Appearance: He is well-developed. He is not diaphoretic.  HENT:     Head: Normocephalic and atraumatic.  Cardiovascular:     Rate and Rhythm: Normal rate and regular rhythm.     Heart sounds: No murmur heard.     No friction rub.  Pulmonary:     Effort: Pulmonary effort is normal. No respiratory distress.     Breath sounds: Normal breath sounds. No wheezing or rales.  Abdominal:     General: Bowel sounds are normal. There is no distension.     Palpations: Abdomen is soft.     Tenderness: There is abdominal tenderness. There is no guarding or rebound.  Musculoskeletal:        General: Normal range of motion.     Cervical back: Normal range of motion and neck supple.  Skin:    General: Skin is warm and dry.  Neurological:     Mental Status: He is alert and oriented to person, place, and time.     Coordination: Coordination normal.     (all labs ordered are listed, but only abnormal results are displayed) Labs Reviewed  COMPREHENSIVE METABOLIC PANEL WITH GFR - Abnormal; Notable for the following components:      Result Value   Glucose, Bld 134 (*)    All other components within normal limits  LIPASE, BLOOD  CBC  URINALYSIS, ROUTINE W REFLEX MICROSCOPIC    EKG: EKG Interpretation Date/Time:  Wednesday January 09 2024 23:40:24 EDT Ventricular Rate:  83 PR Interval:  124 QRS Duration:  86 QT  Interval:  371 QTC Calculation: 436 R Axis:   33  Text Interpretation: Sinus rhythm Borderline T wave abnormalities No significant change since 07/28/2014 Confirmed by Geroldine Berg [phone]) on 01/10/2024 12:03:20 AM  Radiology: No results found.   Procedures   Medications Ordered in the ED  ondansetron  (ZOFRAN ) injection 4 mg (has no administration in time range)  ketorolac  (TORADOL ) 30 MG/ML injection 30 mg (has no administration in time range)  sodium chloride  0.9 % bolus 1,000 mL (has no administration in time range)                                    Medical Decision Making Amount and/or Complexity of Data Reviewed Labs: ordered. Radiology: ordered.  Risk Prescription drug management.   This patient is a 63 year old male presenting with ongoing diarrhea for 3 weeks.  Patient  arrives with stable vital signs and is afebrile.  Abdominal exam reveals tenderness across the upper abdomen.  Laboratory studies obtained including CBC, CMP, and lipase, all of which are unremarkable.  There is no leukocytosis, no electrolyte derangement, and no elevation of liver or pancreatic enzymes.  I did obtain a CT scan of the abdomen and pelvis given the duration of symptoms showing dilated loops of jejunum and ileum, possibly representing an early small bowel obstruction, but more likely representing small bowel enteritis given the history and physical exam.  Patient has been hydrated with normal saline and given Toradol  and Zofran  for pain and nausea.  He was also given a dose of Flagyl .  Patient to be discharged with Flagyl  due to the duration of his diarrhea.  He has to follow-up with primary doctor if this does not help and return to the ER if symptoms worsen or change.     Final diagnoses:  None    ED Discharge Orders     None          Geroldine Berg, MD 01/10/24 0200

## 2024-01-10 NOTE — ED Notes (Signed)
 ED Provider at bedside.

## 2024-01-10 NOTE — ED Notes (Addendum)
 Patient transported to CT

## 2024-01-10 NOTE — Discharge Instructions (Signed)
 Begin taking Flagyl  as prescribed.  Follow-up with primary doctor if not improving in the next few days, and return to the ER if you develop severe abdominal pain, high fevers, bloody stools, or for other new and concerning symptoms.

## 2024-02-06 ENCOUNTER — Ambulatory Visit (HOSPITAL_BASED_OUTPATIENT_CLINIC_OR_DEPARTMENT_OTHER): Admitting: Orthopaedic Surgery

## 2024-02-06 DIAGNOSIS — M79671 Pain in right foot: Secondary | ICD-10-CM

## 2024-02-06 NOTE — Progress Notes (Signed)
 Chief Complaint: Right foot pain     History of Present Illness:   02/06/2024: Presents today for follow-up of his predominantly right foot.  He has been having pain in between the 3rd and 4th metacarpal.  This has been worse with standing directly on it.  He is getting a shooting type pain and numbness in the toes  EDD REPPERT is a 63 y.o. male today with ongoing right knee pain in the setting of 2 previous meniscal debridements with Dr. Margrette.  He did get relief from these initially but has subsequently had some additional medial based pain.  His first surgery was done by Dr. Brenna.  At this point he is having some swelling and pain along the inner aspect of the knee.  He has been trialing anti-inflammatories with some relief.  He is here today for further discussion    PMH/PSH/Family History/Social History/Meds/Allergies:    Past Medical History:  Diagnosis Date   Arthritis    Hypertension    Past Surgical History:  Procedure Laterality Date   COLONOSCOPY N/A 08/15/2013   Procedure: COLONOSCOPY;  Surgeon: Margo LITTIE Haddock, MD;  [facility]: [facility];  Service: Endoscopy;  Laterality: N/A;  9:15 AM   KNEE ARTHROSCOPY WITH MEDIAL MENISECTOMY Right 07/31/2014   Procedure: KNEE ARTHROSCOPY WITH MEDIAL AND LATERAL MENISECTOMY;  Surgeon: Taft FORBES Margrette, MD;  Location: [facility];  Service: Orthopedics;  Laterality: Right;   Left broken arm     screws and plate   NECK SURGERY     cervical c4- c5-screws   Right knee arthroscopy  06/26/1985   XI ROBOTIC ASSISTED INGUINAL HERNIA REPAIR WITH MESH Left 08/25/2022   Procedure: XI ROBOTIC ASSISTED INGUINAL HERNIA REPAIR WITH MESH;  Surgeon: Mavis Anes, MD;  Location: [facility];  Service: General;  Laterality: Left;   Social History   Socioeconomic History   Marital status: Married    Spouse name: Not on file   Number of children: Not on file   Years of education: Not on file   Highest education level: Not on file   Occupational History   Not on file  Tobacco Use   Smoking status: Never   Smokeless tobacco: Current    Types: Chew  Vaping Use   Vaping status: Never Used  Substance and Sexual Activity   Alcohol use: Yes    Alcohol/week: 3.0 standard drinks of alcohol    Types: 3 Cans of beer per week   Drug use: No   Sexual activity: Yes    Birth control/protection: None  Other Topics Concern   Not on file  Social History Narrative   Not on file   Social Drivers of Health   Financial Resource Strain: Not on file  Food Insecurity: Not on file  Transportation Needs: Not on file  Physical Activity: Not on file  Stress: Not on file  Social Connections: Not on file   Family History  Problem Relation Age of Onset   Dementia Paternal Uncle    Colon cancer Neg Hx    No Known Allergies Current Outpatient Medications  Medication Sig Dispense Refill   amLODipine (NORVASC) 5 MG tablet Take 5 mg by mouth daily.     celecoxib (CELEBREX) 200 MG capsule Take 200 mg by mouth daily as needed for moderate pain.     cholecalciferol (VITAMIN D3) 25 MCG (1000 UNIT) tablet Take 1,000 Units by mouth daily.     metroNIDAZOLE  (FLAGYL ) 500 MG tablet Take 1 tablet (  500 mg total) by mouth 3 (three) times daily. 21 tablet 0   Multiple Vitamin (MULTIVITAMIN) tablet Take 1 tablet by mouth daily.     olmesartan (BENICAR) 40 MG tablet Take 40 mg by mouth daily.     No current facility-administered medications for this visit.   No results found.  Review of Systems:   A ROS was performed including pertinent positives and negatives as documented in the HPI.  Physical Exam :   Constitutional: NAD and appears stated age Neurological: Alert and oriented Psych: Appropriate affect and cooperative There were no vitals taken for this visit.   Comprehensive Musculoskeletal Exam:    Positive tenderness with the forefoot squeeze particular about the 3rd and 4th metatarsal.   Imaging:   Xray (4 views right  knee): Medial joint space narrowing consistent with mild osteoarthritis    I personally reviewed and interpreted the radiographs.   Assessment and Plan:   63 y.o. male with moderate osteoarthritis of the right knee.  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  Given this we will plan for a referral to [facility] for diagnostic options although I think he may ultimately need surgical treatment   - Plan for referral to referral to [facility]   I personally saw and evaluated the patient, and participated in the management and treatment plan.  Elspeth Parker, MD Attending Physician, Orthopedic Surgery  This document was dictated using Dragon voice recognition software. A reasonable attempt at proof reading has been made to minimize errors.

## 2024-03-04 ENCOUNTER — Ambulatory Visit (INDEPENDENT_AMBULATORY_CARE_PROVIDER_SITE_OTHER): Admitting: Podiatry

## 2024-03-04 ENCOUNTER — Encounter: Payer: Self-pay | Admitting: Podiatry

## 2024-03-04 ENCOUNTER — Ambulatory Visit (INDEPENDENT_AMBULATORY_CARE_PROVIDER_SITE_OTHER)

## 2024-03-04 DIAGNOSIS — G5781 Other specified mononeuropathies of right lower limb: Secondary | ICD-10-CM | POA: Diagnosis not present

## 2024-03-04 MED ORDER — TRIAMCINOLONE ACETONIDE 40 MG/ML IJ SUSP
20.0000 mg | Freq: Once | INTRAMUSCULAR | Status: AC
  Administered 2024-03-04: 20 mg

## 2024-03-04 NOTE — Progress Notes (Signed)
 Subjective:  Patient ID: Eugene Hill, male    DOB: 1961/05/17,  MRN: [id number] HPI Chief Complaint  Patient presents with   Foot Pain    Dorsal forefoot right - aching, burning, sharp x 1 year, episodes of pain-very sporadic, no injury, tried Celebrex/Tylenol -some help dulls it   New Patient (Initial Visit)    63 y.o. male presents with the above complaint.   ROS: Denies fever chills nausea vomit muscle aches pains calf pain back pain chest pain shortness of breath.  Past Medical History:  Diagnosis Date   Arthritis    Hypertension    Past Surgical History:  Procedure Laterality Date   COLONOSCOPY N/A 08/15/2013   Procedure: COLONOSCOPY;  Surgeon: Margo LITTIE Haddock, MD;  [facility];  Service: Endoscopy;  Laterality: N/A;  9:15 AM   KNEE ARTHROSCOPY WITH MEDIAL MENISECTOMY Right 07/31/2014   Procedure: KNEE ARTHROSCOPY WITH MEDIAL AND LATERAL MENISECTOMY;  Surgeon: Taft FORBES Minerva, MD;  Location: [facility];  Service: Orthopedics;  Laterality: Right;   Left broken arm     screws and plate   NECK SURGERY     cervical c4- c5-screws   Right knee arthroscopy  06/26/1985   XI ROBOTIC ASSISTED INGUINAL HERNIA REPAIR WITH MESH Left 08/25/2022   Procedure: XI ROBOTIC ASSISTED INGUINAL HERNIA REPAIR WITH MESH;  Surgeon: Mavis Anes, MD;  Location: [facility];  Service: General;  Laterality: Left;    Current Outpatient Medications:    rosuvastatin (CRESTOR) 5 MG tablet, Take 5 mg by mouth daily., Disp: , Rfl:    amLODipine (NORVASC) 5 MG tablet, Take 5 mg by mouth daily., Disp: , Rfl:    celecoxib (CELEBREX) 200 MG capsule, Take 200 mg by mouth daily as needed for moderate pain., Disp: , Rfl:    cholecalciferol (VITAMIN D3) 25 MCG (1000 UNIT) tablet, Take 1,000 Units by mouth daily., Disp: , Rfl:    metroNIDAZOLE  (FLAGYL ) 500 MG tablet, Take 1 tablet (500 mg total) by mouth 3 (three) times daily., Disp: 21 tablet, Rfl: 0   Multiple Vitamin (MULTIVITAMIN) tablet, Take 1 tablet  by mouth daily., Disp: , Rfl:    olmesartan (BENICAR) 40 MG tablet, Take 40 mg by mouth daily., Disp: , Rfl:   No Known Allergies Review of Systems Objective:  There were no vitals filed for this visit.  General: Well developed, nourished, in no acute distress, alert and oriented x3   Dermatological: Skin is warm, dry and supple bilateral. Nails x 10 are well maintained; remaining integument appears unremarkable at this time. There are no open sores, no preulcerative lesions, no rash or signs of infection present.  Vascular: Dorsalis Pedis artery and Posterior Tibial artery pedal pulses are 2/4 bilateral with immedate capillary fill time. Pedal hair growth present. No varicosities and no lower extremity edema present bilateral.   Neruologic: Grossly intact via light touch bilateral. Vibratory intact via tuning fork bilateral. Protective threshold with Semmes Wienstein monofilament intact to all pedal sites bilateral. Patellar and Achilles deep tendon reflexes 2+ bilateral. No Babinski or clonus noted bilateral.  Palpable Mulder's click to the third interdigital space of the right foot.  Indicative of neuroma.  No radiating pain.  Musculoskeletal: No gross boney pedal deformities bilateral. No pain, crepitus, or limitation noted with foot and ankle range of motion bilateral. Muscular strength 5/5 in all groups tested bilateral.  Gait: Unassisted, Nonantalgic.    Radiographs:  Radiographs taken today demonstrate osseously mature individual no significant osteoarthritic changes.  There is  a small area of ossification appears to be within the capsule of the first metatarsophalangeal joint dorsally but does not appear to be problematic.  Assessment & Plan:   Assessment: Neuroma third interdigital space right foot  Plan: Injected 10 mg of Kenalog  and local anesthetic to the third interdigital space of the right foot.  We did discuss the possible need for dehydrate alcohol injections and I will  follow-up with him in 1 month or so.     Eugene Hill, [place]

## 2024-04-28 ENCOUNTER — Encounter: Payer: Self-pay | Admitting: Radiology

## 2024-05-13 ENCOUNTER — Ambulatory Visit: Admitting: Podiatry
